# Patient Record
Sex: Female | Born: 1995
Health system: Southern US, Community
[De-identification: ages and names within clinical notes are randomized; demographics above are authoritative.]

## PROBLEM LIST (undated history)

## (undated) ENCOUNTER — Inpatient Hospital Stay (HOSPITAL_COMMUNITY): Payer: Self-pay

## (undated) DIAGNOSIS — R51 Headache: Secondary | ICD-10-CM

## (undated) DIAGNOSIS — IMO0002 Reserved for concepts with insufficient information to code with codable children: Secondary | ICD-10-CM

## (undated) DIAGNOSIS — T7840XA Allergy, unspecified, initial encounter: Secondary | ICD-10-CM

## (undated) DIAGNOSIS — F419 Anxiety disorder, unspecified: Secondary | ICD-10-CM

## (undated) DIAGNOSIS — R519 Headache, unspecified: Secondary | ICD-10-CM

## (undated) DIAGNOSIS — J302 Other seasonal allergic rhinitis: Secondary | ICD-10-CM

## (undated) HISTORY — PX: KNEE SURGERY: SHX244

## (undated) HISTORY — DX: Allergy, unspecified, initial encounter: T78.40XA

---
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
## 2014-02-17 ENCOUNTER — Inpatient Hospital Stay (HOSPITAL_COMMUNITY)
Admission: AD | Admit: 2014-02-17 | Discharge: 2014-02-17 | Disposition: A | Discharge status: Home / Self Care | Payer: BC Managed Care – PPO | Source: Ambulatory Visit | Attending: Obstetrics and Gynecology | Admitting: Obstetrics and Gynecology

## 2014-02-17 ENCOUNTER — Encounter (HOSPITAL_COMMUNITY): Payer: Self-pay

## 2014-02-17 DIAGNOSIS — N909 Noninflammatory disorder of vulva and perineum, unspecified: Secondary | ICD-10-CM | POA: Insufficient documentation

## 2014-02-17 DIAGNOSIS — L293 Anogenital pruritus, unspecified: Secondary | ICD-10-CM | POA: Insufficient documentation

## 2014-02-17 DIAGNOSIS — N9089 Other specified noninflammatory disorders of vulva and perineum: Secondary | ICD-10-CM

## 2014-02-17 DIAGNOSIS — F17200 Nicotine dependence, unspecified, uncomplicated: Secondary | ICD-10-CM | POA: Insufficient documentation

## 2014-02-17 HISTORY — DX: Other seasonal allergic rhinitis: J30.2

## 2014-02-17 LAB — URINALYSIS, ROUTINE W REFLEX MICROSCOPIC
Bilirubin Urine: NEGATIVE
GLUCOSE, UA: NEGATIVE mg/dL
HGB URINE DIPSTICK: NEGATIVE
KETONES UR: NEGATIVE mg/dL
Leukocytes, UA: NEGATIVE
Nitrite: NEGATIVE
PROTEIN: NEGATIVE mg/dL
Specific Gravity, Urine: 1.025 (ref 1.005–1.030)
UROBILINOGEN UA: 0.2 mg/dL (ref 0.0–1.0)
pH: 6 (ref 5.0–8.0)

## 2014-02-17 LAB — WET PREP, GENITAL
Clue Cells Wet Prep HPF POC: NONE SEEN
Trich, Wet Prep: NONE SEEN
Yeast Wet Prep HPF POC: NONE SEEN

## 2014-02-17 LAB — POCT PREGNANCY, URINE: PREG TEST UR: NEGATIVE

## 2014-02-17 NOTE — MAU Provider Note (Signed)
History     CSN: [id number]  Arrival date and [id number]: 02/17/14 [id number]   First Provider Initiated Contact with Patient 02/17/14 [phone]      Chief Complaint  Patient presents with  . Abdominal Pain   HPI Dawn Proctor 18 y.o. Comes to MAU with vulvar irritation which she describes as itching and thick discharge.  Thinks she has a yeast infection and thinks she has been reexposed to chlamydia.  LMP was 02-11-14 and last intercourse was prior to her period.  Does not use condoms and is currently not using any birth control.  States pills cause nausea and she used them for 2 years.  States she used Depo x 6 months and it caused hair loss.  Has an OB/GYN and needs to make an appointment to see them.  OB History   Grav Para Term Preterm Abortions TAB SAB Ect Mult Living   0               Past Medical History  Diagnosis Date  . Allergy   . Anxiety   . Seasonal allergies     History reviewed. No pertinent past surgical history.  Family History  Problem Relation Age of Onset  . Emphysema Paternal Grandmother     History  Substance Use Topics  . Smoking status: Current Every Day Smoker -- 1.00 packs/day    Types: Cigarettes  . Smokeless tobacco: Never Used  . Alcohol Use: No    Allergies: No Known Allergies  Prescriptions prior to admission  Medication Sig Dispense Refill  . acetaminophen (TYLENOL) 325 MG tablet Take 650 mg by mouth every 6 (six) hours as needed for headache.        Review of Systems  Constitutional: Negative for fever.  Gastrointestinal: Negative for nausea, vomiting and abdominal pain.  Genitourinary: Negative for dysuria.       Vulvar itching Vaginal discharge. No vaginal bleeding.   Physical Exam   Blood pressure 108/60, pulse 56, height 5\' 5"  (1.651 m), weight 148 lb 6 oz (67.302 kg).  Physical Exam  Nursing note and vitals reviewed. Constitutional: She is oriented to person, place, and time. She appears well-developed and well-nourished.   HENT:  Head: Normocephalic.  Eyes: EOM are normal.  Neck: Neck supple.  GI: Soft. There is no tenderness.  Genitourinary:  Speculum exam: Vulva  At introitus, tissue is slightly edematous and slightly pearlescent, clitoral piercing noted Vagina - no discharge, no odor, small amount of bleeding noted in cervical mucus. Cervix - No contact bleeding Bimanual exam: Cervix closed Uterus non tender, normal size Adnexa non tender, no masses bilaterally GC/Chlam, wet prep done Chaperone present for exam.  Musculoskeletal: Normal range of motion.  Neurological: She is alert and oriented to person, place, and time.  Skin: Skin is warm and dry.  Psychiatric: She has a normal mood and affect.    MAU Course  Procedures Results for orders placed during the hospital encounter of 02/17/14 (from the past 24 hour(s))  URINALYSIS, ROUTINE W REFLEX MICROSCOPIC     Status: None   Collection Time    02/17/14  9:08 AM      Result Value Ref Range   Color, Urine YELLOW  YELLOW   APPearance CLEAR  CLEAR   Specific Gravity, Urine 1.025  1.005 - 1.030   pH 6.0  5.0 - 8.0   Glucose, UA NEGATIVE  NEGATIVE mg/dL   Hgb urine dipstick NEGATIVE  NEGATIVE   Bilirubin Urine NEGATIVE  NEGATIVE  Ketones, ur NEGATIVE  NEGATIVE mg/dL   Protein, ur NEGATIVE  NEGATIVE mg/dL   Urobilinogen, UA 0.2  0.0 - 1.0 mg/dL   Nitrite NEGATIVE  NEGATIVE   Leukocytes, UA NEGATIVE  NEGATIVE  POCT PREGNANCY, URINE     Status: None   Collection Time    02/17/14  9:19 AM      Result Value Ref Range   Preg Test, Ur NEGATIVE  NEGATIVE  WET PREP, GENITAL     Status: Abnormal   Collection Time    02/17/14 10:04 AM      Result Value Ref Range   Yeast Wet Prep HPF POC NONE SEEN  NONE SEEN   Trich, Wet Prep NONE SEEN  NONE SEEN   Clue Cells Wet Prep HPF POC NONE SEEN  NONE SEEN   WBC, Wet Prep HPF POC FEW (*) NONE SEEN    MDM Records reviewed and client was treated for chlamydia and trich in April 2015.  Discussed  today's results with client - admits she uses vagisil powder daily.  Advised to stop using the powder.  Labs are pending for STD results but no infection found today.  Assessment and Plan  Vulvar irritation likely from Vagisil powder  Plan Make an appointment to be seen in the office for contraception. Use condoms always with intercourse for STD protection. Labs pending. Stop using Vagisil powder.  Do not wear underwear at night.   BURLESON,TERRI 02/17/2014, 10:38 AM

## 2014-02-17 NOTE — MAU Note (Signed)
Awaiting d/c instructions

## 2014-02-17 NOTE — Discharge Instructions (Signed)
Make an appointment in the office to be seen. Always use condoms for intercourse. Do not use Vagisil powder and do not wear underwear at night.  It is fine to wear loose pajama pants or boxer shorts

## 2014-02-19 LAB — GC/CHLAMYDIA PROBE AMP
CT PROBE, AMP APTIMA: NEGATIVE
GC Probe RNA: NEGATIVE

## 2014-03-04 ENCOUNTER — Inpatient Hospital Stay (HOSPITAL_COMMUNITY): Payer: BC Managed Care – PPO

## 2014-03-04 ENCOUNTER — Encounter (HOSPITAL_COMMUNITY): Payer: BC Managed Care – PPO | Admitting: Anesthesiology

## 2014-03-04 ENCOUNTER — Encounter (HOSPITAL_COMMUNITY)
Admission: EM | Disposition: A | Discharge status: Home Health | Payer: Self-pay | Source: Home / Self Care | Attending: Orthopaedic Surgery

## 2014-03-04 ENCOUNTER — Inpatient Hospital Stay (HOSPITAL_COMMUNITY)
Admission: EM | Admit: 2014-03-04 | Discharge: 2014-03-06 | DRG: 908 | Disposition: A | Discharge status: Home Health | Payer: BC Managed Care – PPO | Attending: Orthopaedic Surgery | Admitting: Orthopaedic Surgery

## 2014-03-04 ENCOUNTER — Encounter (HOSPITAL_COMMUNITY): Payer: Self-pay | Admitting: Emergency Medicine

## 2014-03-04 ENCOUNTER — Emergency Department (HOSPITAL_COMMUNITY): Payer: BC Managed Care – PPO

## 2014-03-04 ENCOUNTER — Emergency Department (HOSPITAL_COMMUNITY): Payer: BC Managed Care – PPO | Admitting: Anesthesiology

## 2014-03-04 DIAGNOSIS — S81032A Puncture wound without foreign body, left knee, initial encounter: Secondary | ICD-10-CM

## 2014-03-04 DIAGNOSIS — W3400XA Accidental discharge from unspecified firearms or gun, initial encounter: Secondary | ICD-10-CM

## 2014-03-04 DIAGNOSIS — S91009A Unspecified open wound, unspecified ankle, initial encounter: Principal | ICD-10-CM

## 2014-03-04 DIAGNOSIS — S81009A Unspecified open wound, unspecified knee, initial encounter: Principal | ICD-10-CM | POA: Diagnosis present

## 2014-03-04 DIAGNOSIS — S81809A Unspecified open wound, unspecified lower leg, initial encounter: Principal | ICD-10-CM

## 2014-03-04 DIAGNOSIS — F17200 Nicotine dependence, unspecified, uncomplicated: Secondary | ICD-10-CM | POA: Diagnosis present

## 2014-03-04 DIAGNOSIS — Y9229 Other specified public building as the place of occurrence of the external cause: Secondary | ICD-10-CM

## 2014-03-04 DIAGNOSIS — S82009B Unspecified fracture of unspecified patella, initial encounter for open fracture type I or II: Secondary | ICD-10-CM | POA: Diagnosis present

## 2014-03-04 HISTORY — PX: I & D EXTREMITY: SHX5045

## 2014-03-04 LAB — CBC WITH DIFFERENTIAL/PLATELET
BASOS ABS: 0.1 10*3/uL (ref 0.0–0.1)
BASOS PCT: 1 % (ref 0–1)
EOS PCT: 0 % (ref 0–5)
Eosinophils Absolute: 0 10*3/uL (ref 0.0–0.7)
HEMATOCRIT: 35 % — AB (ref 36.0–46.0)
Hemoglobin: 11 g/dL — ABNORMAL LOW (ref 12.0–15.0)
LYMPHS ABS: 2.8 10*3/uL (ref 0.7–4.0)
Lymphocytes Relative: 47 % — ABNORMAL HIGH (ref 12–46)
MCH: 22.8 pg — AB (ref 26.0–34.0)
MCHC: 31.4 g/dL (ref 30.0–36.0)
MCV: 72.5 fL — AB (ref 78.0–100.0)
MONO ABS: 0.4 10*3/uL (ref 0.1–1.0)
Monocytes Relative: 6 % (ref 3–12)
NEUTROS ABS: 2.7 10*3/uL (ref 1.7–7.7)
Neutrophils Relative %: 46 % (ref 43–77)
PLATELETS: 333 10*3/uL (ref 150–400)
RBC: 4.83 MIL/uL (ref 3.87–5.11)
RDW: 14.3 % (ref 11.5–15.5)
WBC: 6 10*3/uL (ref 4.0–10.5)

## 2014-03-04 LAB — BASIC METABOLIC PANEL
ANION GAP: 21 — AB (ref 5–15)
BUN: 10 mg/dL (ref 6–23)
CO2: 16 mEq/L — ABNORMAL LOW (ref 19–32)
Calcium: 8.9 mg/dL (ref 8.4–10.5)
Chloride: 103 mEq/L (ref 96–112)
Creatinine, Ser: 0.69 mg/dL (ref 0.50–1.10)
GFR calc Af Amer: >90 mL/min (ref >90)
Glucose, Bld: 105 mg/dL — ABNORMAL HIGH (ref 70–99)
POTASSIUM: 3.6 meq/L — AB (ref 3.7–5.3)
SODIUM: 140 meq/L (ref 137–147)

## 2014-03-04 LAB — TYPE AND SCREEN
ABO/RH(D): O POS
ANTIBODY SCREEN: NEGATIVE

## 2014-03-04 LAB — ABO/RH: ABO/RH(D): O POS

## 2014-03-04 SURGERY — IRRIGATION AND DEBRIDEMENT EXTREMITY
Anesthesia: General | Site: Knee | Laterality: Left

## 2014-03-04 MED ORDER — METHOCARBAMOL 1000 MG/10ML IJ SOLN: 500.0000 mg | Freq: Four times a day (QID) | INTRAVENOUS | Status: DC | PRN

## 2014-03-04 MED ORDER — SUCCINYLCHOLINE CHLORIDE 20 MG/ML IJ SOLN
INTRAMUSCULAR | Status: DC | PRN
  Administered 2014-03-04: 120 mg via INTRAVENOUS

## 2014-03-04 MED ORDER — HYDROMORPHONE HCL PF 1 MG/ML IJ SOLN
1.0000 mg | Freq: Once | INTRAMUSCULAR | Status: AC
  Administered 2014-03-04: 1 mg via INTRAVENOUS
  Filled 2014-03-04: qty 1

## 2014-03-04 MED ORDER — PHENYLEPHRINE HCL 10 MG/ML IJ SOLN
INTRAMUSCULAR | Status: DC | PRN
  Administered 2014-03-04: 120 ug via INTRAVENOUS

## 2014-03-04 MED ORDER — TETANUS-DIPHTH-ACELL PERTUSSIS 5-2.5-18.5 LF-MCG/0.5 IM SUSP
0.5000 mL | Freq: Once | INTRAMUSCULAR | Status: AC
  Administered 2014-03-04: 0.5 mL via INTRAMUSCULAR
  Filled 2014-03-04: qty 0.5

## 2014-03-04 MED ORDER — ONDANSETRON HCL 4 MG/2ML IJ SOLN
INTRAMUSCULAR | Status: DC | PRN
  Administered 2014-03-04: 4 mg via INTRAVENOUS

## 2014-03-04 MED ORDER — METOCLOPRAMIDE HCL 10 MG PO TABS: 5.0000 mg | ORAL_TABLET | Freq: Three times a day (TID) | ORAL | Status: DC | PRN

## 2014-03-04 MED ORDER — NEOSTIGMINE METHYLSULFATE 10 MG/10ML IV SOLN
INTRAVENOUS | Status: AC
  Filled 2014-03-04: qty 1

## 2014-03-04 MED ORDER — BUPIVACAINE HCL (PF) 0.25 % IJ SOLN
INTRAMUSCULAR | Status: DC | PRN
  Administered 2014-03-04: 20 mL

## 2014-03-04 MED ORDER — SODIUM CHLORIDE 0.9 % IV SOLN
Freq: Once | INTRAVENOUS | Status: AC
  Administered 2014-03-04: 05:00:00 via INTRAVENOUS

## 2014-03-04 MED ORDER — SODIUM CHLORIDE 0.9 % IR SOLN
Status: DC | PRN
  Administered 2014-03-04: 1000 mL

## 2014-03-04 MED ORDER — MIDAZOLAM HCL 5 MG/5ML IJ SOLN
INTRAMUSCULAR | Status: DC | PRN
Start: 2014-03-04 — End: 2014-03-04
  Administered 2014-03-04: 2 mg via INTRAVENOUS

## 2014-03-04 MED ORDER — DOCUSATE SODIUM 100 MG PO CAPS
100.0000 mg | ORAL_CAPSULE | Freq: Two times a day (BID) | ORAL | Status: DC
  Administered 2014-03-04 – 2014-03-06 (×5): 100 mg via ORAL
  Filled 2014-03-04 (×6): qty 1

## 2014-03-04 MED ORDER — HYDROMORPHONE HCL PF 1 MG/ML IJ SOLN
INTRAMUSCULAR | Status: AC
  Administered 2014-03-04: 0.5 mg via INTRAVENOUS
  Filled 2014-03-04: qty 1

## 2014-03-04 MED ORDER — FENTANYL CITRATE 0.05 MG/ML IJ SOLN
INTRAMUSCULAR | Status: AC
  Filled 2014-03-04: qty 5

## 2014-03-04 MED ORDER — PHENYLEPHRINE 40 MCG/ML (10ML) SYRINGE FOR IV PUSH (FOR BLOOD PRESSURE SUPPORT)
PREFILLED_SYRINGE | INTRAVENOUS | Status: AC
  Filled 2014-03-04: qty 10

## 2014-03-04 MED ORDER — METHOCARBAMOL 500 MG PO TABS
500.0000 mg | ORAL_TABLET | Freq: Four times a day (QID) | ORAL | Status: DC | PRN
  Administered 2014-03-04 – 2014-03-06 (×7): 500 mg via ORAL
  Filled 2014-03-04 (×7): qty 1

## 2014-03-04 MED ORDER — ONDANSETRON HCL 4 MG/2ML IJ SOLN
INTRAMUSCULAR | Status: AC
  Filled 2014-03-04: qty 2

## 2014-03-04 MED ORDER — FENTANYL CITRATE 0.05 MG/ML IJ SOLN
100.0000 ug | Freq: Once | INTRAMUSCULAR | Status: AC
  Administered 2014-03-04: 100 ug via INTRAVENOUS
  Filled 2014-03-04: qty 2

## 2014-03-04 MED ORDER — GLYCOPYRROLATE 0.2 MG/ML IJ SOLN
INTRAMUSCULAR | Status: AC
  Filled 2014-03-04: qty 4

## 2014-03-04 MED ORDER — CEFAZOLIN SODIUM-DEXTROSE 2-3 GM-% IV SOLR
2.0000 g | Freq: Four times a day (QID) | INTRAVENOUS | Status: AC
  Administered 2014-03-04 (×2): 2 g via INTRAVENOUS
  Filled 2014-03-04 (×3): qty 50

## 2014-03-04 MED ORDER — DEXAMETHASONE SODIUM PHOSPHATE 10 MG/ML IJ SOLN
INTRAMUSCULAR | Status: DC | PRN
  Administered 2014-03-04: 8 mg via INTRAVENOUS

## 2014-03-04 MED ORDER — DEXTROSE 5 % IV SOLN
INTRAVENOUS | Status: DC | PRN
  Administered 2014-03-04: 06:00:00 via INTRAVENOUS

## 2014-03-04 MED ORDER — ENOXAPARIN SODIUM 40 MG/0.4ML ~~LOC~~ SOLN
40.0000 mg | SUBCUTANEOUS | Status: DC
  Administered 2014-03-05 – 2014-03-06 (×2): 40 mg via SUBCUTANEOUS
  Filled 2014-03-04 (×3): qty 0.4

## 2014-03-04 MED ORDER — LACTATED RINGERS IV SOLN
INTRAVENOUS | Status: DC | PRN
  Administered 2014-03-04: 06:00:00 via INTRAVENOUS

## 2014-03-04 MED ORDER — FENTANYL CITRATE 0.05 MG/ML IJ SOLN
INTRAMUSCULAR | Status: DC | PRN
  Administered 2014-03-04: 100 ug via INTRAVENOUS
  Administered 2014-03-04 (×2): 50 ug via INTRAVENOUS

## 2014-03-04 MED ORDER — CEFAZOLIN SODIUM 1-5 GM-% IV SOLN
INTRAVENOUS | Status: DC | PRN
  Administered 2014-03-04 (×2): 1 g via INTRAVENOUS

## 2014-03-04 MED ORDER — MIDAZOLAM HCL 2 MG/2ML IJ SOLN
INTRAMUSCULAR | Status: AC
  Filled 2014-03-04: qty 2

## 2014-03-04 MED ORDER — METOCLOPRAMIDE HCL 5 MG/ML IJ SOLN: 5.0000 mg | Freq: Three times a day (TID) | INTRAMUSCULAR | Status: DC | PRN

## 2014-03-04 MED ORDER — LIDOCAINE HCL (CARDIAC) 20 MG/ML IV SOLN
INTRAVENOUS | Status: DC | PRN
  Administered 2014-03-04: 60 mg via INTRAVENOUS

## 2014-03-04 MED ORDER — LIDOCAINE HCL (CARDIAC) 20 MG/ML IV SOLN
INTRAVENOUS | Status: AC
  Filled 2014-03-04: qty 5

## 2014-03-04 MED ORDER — HYDROMORPHONE HCL PF 1 MG/ML IJ SOLN
0.2500 mg | INTRAMUSCULAR | Status: DC | PRN
  Administered 2014-03-04 (×4): 0.5 mg via INTRAVENOUS

## 2014-03-04 MED ORDER — NEOSTIGMINE METHYLSULFATE 10 MG/10ML IV SOLN
INTRAVENOUS | Status: DC | PRN
  Administered 2014-03-04: 4 mg via INTRAVENOUS

## 2014-03-04 MED ORDER — GLYCOPYRROLATE 0.2 MG/ML IJ SOLN
INTRAMUSCULAR | Status: DC | PRN
  Administered 2014-03-04: .8 mg via INTRAVENOUS

## 2014-03-04 MED ORDER — CEFAZOLIN SODIUM 1-5 GM-% IV SOLN
1.0000 g | Freq: Once | INTRAVENOUS | Status: AC
  Administered 2014-03-04: 1 g via INTRAVENOUS
  Filled 2014-03-04: qty 50

## 2014-03-04 MED ORDER — PROPOFOL 10 MG/ML IV BOLUS
INTRAVENOUS | Status: DC | PRN
  Administered 2014-03-04: 200 mg via INTRAVENOUS

## 2014-03-04 MED ORDER — HYDROCODONE-ACETAMINOPHEN 5-325 MG PO TABS
1.0000 | ORAL_TABLET | ORAL | Status: DC | PRN
  Administered 2014-03-04: 2 via ORAL
  Filled 2014-03-04: qty 2

## 2014-03-04 MED ORDER — HYDROMORPHONE HCL PF 1 MG/ML IJ SOLN
0.5000 mg | INTRAMUSCULAR | Status: DC | PRN
  Administered 2014-03-04 (×2): 1 mg via INTRAVENOUS
  Filled 2014-03-04 (×2): qty 1

## 2014-03-04 MED ORDER — POTASSIUM CHLORIDE IN NACL 20-0.9 MEQ/L-% IV SOLN
INTRAVENOUS | Status: DC
  Administered 2014-03-04 – 2014-03-05 (×2): via INTRAVENOUS
  Filled 2014-03-04 (×6): qty 1000

## 2014-03-04 MED ORDER — ACETAMINOPHEN 325 MG PO TABS: 650.0000 mg | ORAL_TABLET | Freq: Four times a day (QID) | ORAL | Status: DC | PRN

## 2014-03-04 MED ORDER — ONDANSETRON HCL 4 MG PO TABS: 4.0000 mg | ORAL_TABLET | Freq: Four times a day (QID) | ORAL | Status: DC | PRN

## 2014-03-04 MED ORDER — CEFAZOLIN SODIUM 1-5 GM-% IV SOLN
INTRAVENOUS | Status: AC
  Filled 2014-03-04: qty 50

## 2014-03-04 MED ORDER — ROCURONIUM BROMIDE 100 MG/10ML IV SOLN
INTRAVENOUS | Status: DC | PRN
  Administered 2014-03-04: 20 mg via INTRAVENOUS

## 2014-03-04 MED ORDER — METHOCARBAMOL 500 MG PO TABS
ORAL_TABLET | ORAL | Status: AC
  Filled 2014-03-04: qty 1

## 2014-03-04 MED ORDER — SODIUM CHLORIDE 0.9 % IV SOLN
INTRAVENOUS | Status: DC | PRN
  Administered 2014-03-04: 06:00:00 via INTRAVENOUS

## 2014-03-04 MED ORDER — OXYCODONE-ACETAMINOPHEN 5-325 MG PO TABS
1.0000 | ORAL_TABLET | ORAL | Status: DC | PRN
  Administered 2014-03-04 – 2014-03-06 (×11): 2 via ORAL
  Filled 2014-03-04 (×11): qty 2

## 2014-03-04 MED ORDER — ARTIFICIAL TEARS OP OINT
TOPICAL_OINTMENT | OPHTHALMIC | Status: AC
  Filled 2014-03-04: qty 3.5

## 2014-03-04 MED ORDER — ONDANSETRON HCL 4 MG/2ML IJ SOLN: 4.0000 mg | Freq: Once | INTRAMUSCULAR | Status: DC | PRN

## 2014-03-04 MED ORDER — ARTIFICIAL TEARS OP OINT
TOPICAL_OINTMENT | OPHTHALMIC | Status: DC | PRN
  Administered 2014-03-04: 1 via OPHTHALMIC

## 2014-03-04 MED ORDER — PROPOFOL 10 MG/ML IV BOLUS
INTRAVENOUS | Status: AC
Start: 2014-03-04 — End: 2014-03-04
  Filled 2014-03-04: qty 20

## 2014-03-04 MED ORDER — CEFAZOLIN SODIUM-DEXTROSE 2-3 GM-% IV SOLR: 2.0000 g | Freq: Once | INTRAVENOUS | Status: DC

## 2014-03-04 MED ORDER — ONDANSETRON HCL 4 MG/2ML IJ SOLN: 4.0000 mg | Freq: Four times a day (QID) | INTRAMUSCULAR | Status: DC | PRN

## 2014-03-04 SURGICAL SUPPLY — 46 items
BANDAGE ELASTIC 6 VELCRO ST LF (GAUZE/BANDAGES/DRESSINGS) ×1 IMPLANT
BANDAGE ESMARK 6X9 LF (GAUZE/BANDAGES/DRESSINGS) IMPLANT
BNDG CMPR 9X6 STRL LF SNTH (GAUZE/BANDAGES/DRESSINGS) ×1
BNDG ESMARK 6X9 LF (GAUZE/BANDAGES/DRESSINGS) ×2
CLEANER TIP ELECTROSURG 2X2 (MISCELLANEOUS) ×1 IMPLANT
CONT SPEC 4OZ CLIKSEAL STRL BL (MISCELLANEOUS) ×1 IMPLANT
COVER SURGICAL LIGHT HANDLE (MISCELLANEOUS) ×2 IMPLANT
CUFF TOURNIQUET SINGLE 18IN (TOURNIQUET CUFF) ×2 IMPLANT
CUFF TOURNIQUET SINGLE 24IN (TOURNIQUET CUFF) ×1 IMPLANT
ELECT REM PT RETURN 9FT ADLT (ELECTROSURGICAL)
ELECTRODE REM PT RTRN 9FT ADLT (ELECTROSURGICAL) IMPLANT
GAUZE XEROFORM 1X8 LF (GAUZE/BANDAGES/DRESSINGS) ×1 IMPLANT
GLOVE BIO SURGEON STRL SZ8 (GLOVE) ×1 IMPLANT
GLOVE BIOGEL PI IND STRL 8 (GLOVE) ×1 IMPLANT
GLOVE BIOGEL PI INDICATOR 8 (GLOVE) ×1
GLOVE ORTHO TXT STRL SZ7.5 (GLOVE) ×2 IMPLANT
GOWN STRL REUS W/ TWL LRG LVL3 (GOWN DISPOSABLE) ×2 IMPLANT
GOWN STRL REUS W/ TWL XL LVL3 (GOWN DISPOSABLE) ×1 IMPLANT
GOWN STRL REUS W/TWL LRG LVL3 (GOWN DISPOSABLE) ×4
GOWN STRL REUS W/TWL XL LVL3 (GOWN DISPOSABLE) ×2
IMMOBILIZER KNEE 22  40 CIR (ORTHOPEDIC SUPPLIES) ×1
IMMOBILIZER KNEE 22 40 CIR (ORTHOPEDIC SUPPLIES) IMPLANT
IMMOBILIZER KNEE 22 UNIV (SOFTGOODS) ×1 IMPLANT
KIT BASIN OR (CUSTOM PROCEDURE TRAY) ×2 IMPLANT
KIT ROOM TURNOVER OR (KITS) ×2 IMPLANT
MANIFOLD NEPTUNE II (INSTRUMENTS) ×2 IMPLANT
NEEDLE 22X1 1/2 (OR ONLY) (NEEDLE) ×1 IMPLANT
NS IRRIG 1000ML POUR BTL (IV SOLUTION) ×2 IMPLANT
PACK ORTHO EXTREMITY (CUSTOM PROCEDURE TRAY) ×2 IMPLANT
PAD ABD 8X10 STRL (GAUZE/BANDAGES/DRESSINGS) ×1 IMPLANT
PAD ARMBOARD 7.5X6 YLW CONV (MISCELLANEOUS) ×4 IMPLANT
PADDING CAST COTTON 6X4 STRL (CAST SUPPLIES) ×1 IMPLANT
SPONGE GAUZE 4X4 12PLY STER LF (GAUZE/BANDAGES/DRESSINGS) ×1 IMPLANT
SPONGE LAP 18X18 X RAY DECT (DISPOSABLE) ×2 IMPLANT
STAPLER VISISTAT 35W (STAPLE) ×1 IMPLANT
STOCKINETTE IMPERVIOUS 9X36 MD (GAUZE/BANDAGES/DRESSINGS) ×2 IMPLANT
SUT VIC AB 0 CT1 27 (SUTURE) ×2
SUT VIC AB 0 CT1 27XBRD ANBCTR (SUTURE) IMPLANT
SUT VIC AB 2-0 CT1 27 (SUTURE) ×2
SUT VIC AB 2-0 CT1 TAPERPNT 27 (SUTURE) IMPLANT
SYR BULB IRRIGATION 50ML (SYRINGE) ×1 IMPLANT
SYR CONTROL 10ML LL (SYRINGE) ×1 IMPLANT
TOWEL OR 17X26 10 PK STRL BLUE (TOWEL DISPOSABLE) ×2 IMPLANT
TUBE CONNECTING 12X1/4 (SUCTIONS) ×2 IMPLANT
UNDERPAD 30X30 INCONTINENT (UNDERPADS AND DIAPERS) ×2 IMPLANT
YANKAUER SUCT BULB TIP NO VENT (SUCTIONS) ×2 IMPLANT

## 2014-03-04 NOTE — Progress Notes (Signed)
Patient requests " that D stuff in the IV". Patients mother states no, tells patient she is medicated with pills.
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
## 2014-03-04 NOTE — Anesthesia Procedure Notes (Signed)
Procedure Name: Intubation Date/Time: 03/04/2014 5:59 AM Performed by: Wray KearnsFOLEY, Olanda Downie A Pre-anesthesia Checklist: Patient identified, Timeout performed, Emergency Drugs available, Suction available and Patient being monitored Patient Re-evaluated:Patient Re-evaluated prior to inductionOxygen Delivery Method: Circle system utilized Preoxygenation: Pre-oxygenation with 100% oxygen Intubation Type: IV induction, Rapid sequence and Cricoid Pressure applied Laryngoscope Size: Mac and 4 Grade View: Grade I Tube type: Oral Tube size: 7.0 mm Number of attempts: 1 Airway Equipment and Method: Stylet Placement Confirmation: ETT inserted through vocal cords under direct vision,  breath sounds checked- equal and bilateral and positive ETCO2 Secured at: 22 cm Tube secured with: Tape Dental Injury: Teeth and Oropharynx as per pre-operative assessment
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
## 2014-03-04 NOTE — Progress Notes (Signed)
Utilization review completed.  
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
## 2014-03-05 LAB — BASIC METABOLIC PANEL
Anion gap: 17 — ABNORMAL HIGH (ref 5–15)
BUN: 8 mg/dL (ref 6–23)
CO2: 17 mEq/L — ABNORMAL LOW (ref 19–32)
CREATININE: 0.69 mg/dL (ref 0.50–1.10)
Calcium: 8.7 mg/dL (ref 8.4–10.5)
Chloride: 102 mEq/L (ref 96–112)
GFR calc Af Amer: >90 mL/min (ref >90)
Glucose, Bld: 79 mg/dL (ref 70–99)
Potassium: 4.8 mEq/L (ref 3.7–5.3)
Sodium: 136 mEq/L — ABNORMAL LOW (ref 137–147)

## 2014-03-05 LAB — MRSA PCR SCREENING: MRSA by PCR: NEGATIVE

## 2014-03-05 LAB — CBC
HCT: 31.2 % — ABNORMAL LOW (ref 36.0–46.0)
Hemoglobin: 9.7 g/dL — ABNORMAL LOW (ref 12.0–15.0)
MCH: 22.1 pg — AB (ref 26.0–34.0)
MCHC: 31.1 g/dL (ref 30.0–36.0)
MCV: 71.1 fL — AB (ref 78.0–100.0)
Platelets: 338 10*3/uL (ref 150–400)
RBC: 4.39 MIL/uL (ref 3.87–5.11)
RDW: 14 % (ref 11.5–15.5)
WBC: 10.5 10*3/uL (ref 4.0–10.5)
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
## 2014-03-05 NOTE — Evaluation (Signed)
I have read and agree with this note.   Time in/out:1311-1337  Total time: 26 minutes (Ev, 1SC)  Ignacia Palmaathy Eustace Hur, OTR/L [phone]
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
## 2014-03-06 ENCOUNTER — Encounter (HOSPITAL_COMMUNITY): Payer: Self-pay | Admitting: Orthopaedic Surgery

## 2014-03-06 MED ORDER — OXYCODONE-ACETAMINOPHEN 5-325 MG PO TABS: 1.0000 | ORAL_TABLET | ORAL | Status: DC | PRN

## 2014-03-06 MED ORDER — ASPIRIN 325 MG PO TABS: 325.0000 mg | ORAL_TABLET | Freq: Every day | ORAL | Status: DC

## 2014-03-06 NOTE — Progress Notes (Signed)
Orthopedic Tech Progress Note Patient Details:  Dawn BridgemanBrianna M Proctor [birth year] [id number]  Ortho Devices Type of Ortho Device: Crutches Ortho Device/Splint Interventions: Application   Shawnie PonsCammer, Tyese Finken Carol 03/06/2014, 2:53 PM

## 2014-03-06 NOTE — Progress Notes (Signed)
I have read and agree with this note.   Time in/out: 812-852 Total time: 40 minutes (3SC)  Ignacia Palmaathy Etheleen Valtierra, OTR/L [phone]
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
## 2014-03-06 NOTE — Progress Notes (Signed)
Patient discharged to home accompanied by family. Discharge instructions and rx given and explained and patient stated understanding. IV was removed and patient left unit in a stable condition with all personal belongings via wheelchair.  
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
## 2014-05-14 ENCOUNTER — Inpatient Hospital Stay (HOSPITAL_COMMUNITY)
Admission: AD | Admit: 2014-05-14 | Discharge: 2014-05-14 | Disposition: A | Discharge status: Home / Self Care | Payer: BC Managed Care – PPO | Source: Ambulatory Visit | Attending: Family Medicine | Admitting: Family Medicine

## 2014-05-14 ENCOUNTER — Encounter (HOSPITAL_COMMUNITY): Payer: Self-pay | Admitting: *Deleted

## 2014-05-14 DIAGNOSIS — A499 Bacterial infection, unspecified: Secondary | ICD-10-CM | POA: Insufficient documentation

## 2014-05-14 DIAGNOSIS — B9689 Other specified bacterial agents as the cause of diseases classified elsewhere: Secondary | ICD-10-CM | POA: Insufficient documentation

## 2014-05-14 DIAGNOSIS — N390 Urinary tract infection, site not specified: Secondary | ICD-10-CM | POA: Diagnosis not present

## 2014-05-14 DIAGNOSIS — R300 Dysuria: Secondary | ICD-10-CM | POA: Diagnosis present

## 2014-05-14 DIAGNOSIS — N760 Acute vaginitis: Secondary | ICD-10-CM | POA: Diagnosis not present

## 2014-05-14 DIAGNOSIS — F17200 Nicotine dependence, unspecified, uncomplicated: Secondary | ICD-10-CM | POA: Diagnosis not present

## 2014-05-14 LAB — WET PREP, GENITAL
Trich, Wet Prep: NONE SEEN
Yeast Wet Prep HPF POC: NONE SEEN

## 2014-05-14 LAB — POCT PREGNANCY, URINE: Preg Test, Ur: NEGATIVE

## 2014-05-14 LAB — URINALYSIS, ROUTINE W REFLEX MICROSCOPIC
Bilirubin Urine: NEGATIVE
Glucose, UA: NEGATIVE mg/dL
Ketones, ur: NEGATIVE mg/dL
Nitrite: NEGATIVE
Protein, ur: NEGATIVE mg/dL
Specific Gravity, Urine: 1.015 (ref 1.005–1.030)
Urobilinogen, UA: 0.2 mg/dL (ref 0.0–1.0)
pH: 6 (ref 5.0–8.0)

## 2014-05-14 LAB — URINE MICROSCOPIC-ADD ON

## 2014-05-14 LAB — RPR

## 2014-05-14 LAB — HIV ANTIBODY (ROUTINE TESTING W REFLEX): HIV: NONREACTIVE

## 2014-05-14 MED ORDER — METRONIDAZOLE 0.75 % VA GEL: 1.0000 | Freq: Every day | VAGINAL | Status: DC

## 2014-05-14 MED ORDER — SULFAMETHOXAZOLE-TMP DS 800-160 MG PO TABS: 1.0000 | ORAL_TABLET | Freq: Two times a day (BID) | ORAL | Status: DC

## 2014-05-14 NOTE — MAU Note (Signed)
Frequent urination,pain when finished.  White d/c, no irritation.  No pain during sex, but it hurts after when she uses the restroom

## 2014-05-14 NOTE — MAU Provider Note (Signed)
[facility] Visit  Patient name: Dawn Proctor MRN [id number]  Date of birth: [birth year]  CC & HPI:  Dawn Proctor is a 18 y.o. African American female presenting today for c/o dysuria.  She also c/o thicker white vaginal d/c.  She requests STD screening.    Pertinent History Reviewed:  Medical & Surgical Hx:   Past Medical History  Diagnosis Date  . Allergy   . Anxiety   . Seasonal allergies    Past Surgical History  Procedure Laterality Date  . I&d extremity Left 03/04/2014    Procedure: IRRIGATION AND DEBRIDEMENT KNEE ARTHROTOMY WITH REMOVAL OF BULLET FRAGMENTS;  Surgeon: Eldred Manges, MD;  Location: [facility] OR;  Service: Orthopedics;  Laterality: Left;   Medications: Reviewed & Updated - see associated section Social History: Reviewed -  reports that she has been smoking Cigarettes.  She has been smoking about 1.00 pack per day. She has never used smokeless tobacco.  Objective Findings:  Vitals: BP 112/58  Pulse 64  Temp(Src) 98.9 F (37.2 C) (Oral)  Resp 16  Ht  (1.651 m)  Wt 65.772 kg (145 lb)  BMI 24.13 kg/m2  LMP 05/01/2014  Physical Examination: General appearance - alert, well appearing, and in no distress Mental status - alert, oriented to person, place, and time Chest - clear to auscultation, no wheezes, rales or rhonchi, symmetric air entry Heart - normal rate and regular rhythm Abdomen - soft, nontender, nondistended, no masses or organomegaly Pelvic - SSE:  Thin white discharge with amine odor. Extremities - no pedal edema noted  Results for orders placed during the hospital encounter of 05/14/14 (from the past 24 hour(s))  URINALYSIS, ROUTINE W REFLEX MICROSCOPIC   Collection Time    05/14/14  4:07 PM      Result Value Ref Range   Color, Urine YELLOW  YELLOW   APPearance HAZY (*) CLEAR   Specific Gravity, Urine 1.015  1.005 - 1.030   pH 6.0  5.0 - 8.0   Glucose, UA NEGATIVE  NEGATIVE mg/dL   Hgb urine dipstick LARGE (*) NEGATIVE   Bilirubin Urine NEGATIVE  NEGATIVE   Ketones, ur NEGATIVE  NEGATIVE mg/dL   Protein, ur NEGATIVE  NEGATIVE mg/dL   Urobilinogen, UA 0.2  0.0 - 1.0 mg/dL   Nitrite NEGATIVE  NEGATIVE   Leukocytes, UA MODERATE (*) NEGATIVE  URINE MICROSCOPIC-ADD ON   Collection Time    05/14/14  4:07 PM      Result Value Ref Range   Squamous Epithelial / LPF FEW (*) RARE   WBC, UA TOO NUMEROUS TO COUNT  <3 WBC/hpf   RBC / HPF 11-20  <3 RBC/hpf   Bacteria, UA FEW (*) RARE   Urine-Other MUCOUS PRESENT    POCT PREGNANCY, URINE   Collection Time    05/14/14  4:10 PM      Result Value Ref Range   Preg Test, Ur NEGATIVE  NEGATIVE  WET PREP, GENITAL   Collection Time    05/14/14  5:30 PM      Result Value Ref Range   Yeast Wet Prep HPF POC NONE SEEN  NONE SEEN   Trich, Wet Prep NONE SEEN  NONE SEEN   Clue Cells Wet Prep HPF POC MODERATE (*) NONE SEEN   WBC, Wet Prep HPF POC MANY (*) NONE SEEN     Assessment & Plan:  A:   UTI  Bacterial vaginosis P:  Septra DS PO BID X 5  Metrogel qhs X5   CRESENZO-DISHMAN,Jamey Demchak CNM 05/14/2014 5:56 PM

## 2014-05-14 NOTE — Discharge Instructions (Signed)
Urinary Tract Infection Urinary tract infections (UTIs) can develop anywhere along your urinary tract. Your urinary tract is your body's drainage system for removing wastes and extra water. Your urinary tract includes two kidneys, two ureters, a bladder, and a urethra. Your kidneys are a pair of bean-shaped organs. Each kidney is about the size of your fist. They are located below your ribs, one on each side of your spine. CAUSES Infections are caused by microbes, which are microscopic organisms, including fungi, viruses, and bacteria. These organisms are so small that they can only be seen through a microscope. Bacteria are the microbes that most commonly cause UTIs. SYMPTOMS  Symptoms of UTIs may vary by age and gender of the patient and by the location of the infection. Symptoms in young women typically include a frequent and intense urge to urinate and a painful, burning feeling in the bladder or urethra during urination. Older women and men are more likely to be tired, shaky, and weak and have muscle aches and abdominal pain. A fever may mean the infection is in your kidneys. Other symptoms of a kidney infection include pain in your back or sides below the ribs, nausea, and vomiting. DIAGNOSIS To diagnose a UTI, your caregiver will ask you about your symptoms. Your caregiver also will ask to provide a urine sample. The urine sample will be tested for bacteria and white blood cells. White blood cells are made by your body to help fight infection. TREATMENT  Typically, UTIs can be treated with medication. Because most UTIs are caused by a bacterial infection, they usually can be treated with the use of antibiotics. The choice of antibiotic and length of treatment depend on your symptoms and the type of bacteria causing your infection. HOME CARE INSTRUCTIONS  If you were prescribed antibiotics, take them exactly as your caregiver instructs you. Finish the medication even if you feel better after you  have only taken some of the medication.  Drink enough water and fluids to keep your urine clear or pale yellow.  Avoid caffeine, tea, and carbonated beverages. They tend to irritate your bladder.  Empty your bladder often. Avoid holding urine for long periods of time.  Empty your bladder before and after sexual intercourse.  After a bowel movement, women should cleanse from front to back. Use each tissue only once. SEEK MEDICAL CARE IF:   You have back pain.  You develop a fever.  Your symptoms do not begin to resolve within 3 days. SEEK IMMEDIATE MEDICAL CARE IF:   You have severe back pain or lower abdominal pain.  You develop chills.  You have nausea or vomiting.  You have continued burning or discomfort with urination. MAKE SURE YOU:   Understand these instructions.  Will watch your condition.  Will get help right away if you are not doing well or get worse. Document Released: 05/13/2005 Document Revised: 02/02/2012 Document Reviewed: 09/11/2011 ExitCare Patient Information 2015 [employer]. This information is not intended to replace advice given to you by your health care provider. Make sure you discuss any questions you have with your health care provider.  Bacterial Vaginosis Bacterial vaginosis is a vaginal infection that occurs when the normal balance of bacteria in the vagina is disrupted. It results from an overgrowth of certain bacteria. This is the most common vaginal infection in women of childbearing age. Treatment is important to prevent complications, especially in pregnant women, as it can cause a premature delivery. CAUSES  Bacterial vaginosis is caused by an   increase in harmful bacteria that are normally present in smaller amounts in the vagina. Several different kinds of bacteria can cause bacterial vaginosis. However, the reason that the condition develops is not fully understood. RISK FACTORS Certain activities or behaviors can put you at an  increased risk of developing bacterial vaginosis, including:  Having a new sex partner or multiple sex partners.  Douching.  Using an intrauterine device (IUD) for contraception. Women do not get bacterial vaginosis from toilet seats, bedding, swimming pools, or contact with objects around them. SIGNS AND SYMPTOMS  Some women with bacterial vaginosis have no signs or symptoms. Common symptoms include:  Grey vaginal discharge.  A fishlike odor with discharge, especially after sexual intercourse.  Itching or burning of the vagina and vulva.  Burning or pain with urination. DIAGNOSIS  Your health care provider will take a medical history and examine the vagina for signs of bacterial vaginosis. A sample of vaginal fluid may be taken. Your health care provider will look at this sample under a microscope to check for bacteria and abnormal cells. A vaginal pH test may also be done.  TREATMENT  Bacterial vaginosis may be treated with antibiotic medicines. These may be given in the form of a pill or a vaginal cream. A second round of antibiotics may be prescribed if the condition comes back after treatment.  HOME CARE INSTRUCTIONS   Only take over-the-counter or prescription medicines as directed by your health care provider.  If antibiotic medicine was prescribed, take it as directed. Make sure you finish it even if you start to feel better.  Do not have sex until treatment is completed.  Tell all sexual partners that you have a vaginal infection. They should see their health care provider and be treated if they have problems, such as a mild rash or itching.  Practice safe sex by using condoms and only having one sex partner. SEEK MEDICAL CARE IF:   Your symptoms are not improving after 3 days of treatment.  You have increased discharge or pain.  You have a fever. MAKE SURE YOU:   Understand these instructions.  Will watch your condition.  Will get help right away if you are not  doing well or get worse. FOR MORE INFORMATION  Centers for Disease Control and Prevention, Division of STD Prevention: www.cdc.gov/std American Sexual Health Association (ASHA): www.ashastd.org  Document Released: 08/03/2005 Document Revised: 05/24/2013 Document Reviewed: 03/15/2013 ExitCare Patient Information 2015 [employer]. This information is not intended to replace advice given to you by your health care provider. Make sure you discuss any questions you have with your health care provider.  

## 2014-05-15 LAB — HSV 2 ANTIBODY, IGG: HSV 2 Glycoprotein G Ab, IgG: 0.19 IV

## 2014-05-15 LAB — GC/CHLAMYDIA PROBE AMP
CT PROBE, AMP APTIMA: NEGATIVE
GC Probe RNA: NEGATIVE

## 2014-05-15 NOTE — MAU Provider Note (Signed)
Attestation of Attending Supervision of Advanced Practitioner (PA/CNM/NP): Evaluation and management procedures were performed by the Advanced Practitioner under my supervision and collaboration.  I have reviewed the Advanced Practitioner's note and chart, and I agree with the management and plan.  Jacob Stinson, DO Attending Physician Faculty Practice, [facility]  

## 2014-05-17 LAB — URINE CULTURE: Colony Count: >=100000

## 2014-06-01 ENCOUNTER — Other Ambulatory Visit: Payer: Self-pay

## 2014-06-18 ENCOUNTER — Encounter (HOSPITAL_COMMUNITY): Payer: Self-pay | Admitting: Emergency Medicine

## 2014-06-18 ENCOUNTER — Ambulatory Visit (HOSPITAL_COMMUNITY): Payer: BC Managed Care – PPO | Attending: Emergency Medicine

## 2014-06-18 ENCOUNTER — Emergency Department (HOSPITAL_COMMUNITY)
Admission: EM | Admit: 2014-06-18 | Discharge: 2014-06-18 | Disposition: A | Discharge status: Home / Self Care | Payer: Worker's Compensation | Attending: Emergency Medicine | Admitting: Emergency Medicine

## 2014-06-18 DIAGNOSIS — Z792 Long term (current) use of antibiotics: Secondary | ICD-10-CM | POA: Insufficient documentation

## 2014-06-18 DIAGNOSIS — Z7982 Long term (current) use of aspirin: Secondary | ICD-10-CM | POA: Insufficient documentation

## 2014-06-18 DIAGNOSIS — S79912A Unspecified injury of left hip, initial encounter: Secondary | ICD-10-CM | POA: Insufficient documentation

## 2014-06-18 DIAGNOSIS — W19XXXA Unspecified fall, initial encounter: Secondary | ICD-10-CM

## 2014-06-18 DIAGNOSIS — S8002XA Contusion of left knee, initial encounter: Secondary | ICD-10-CM | POA: Diagnosis not present

## 2014-06-18 DIAGNOSIS — Z720 Tobacco use: Secondary | ICD-10-CM | POA: Diagnosis not present

## 2014-06-18 DIAGNOSIS — S8992XA Unspecified injury of left lower leg, initial encounter: Secondary | ICD-10-CM | POA: Diagnosis present

## 2014-06-18 DIAGNOSIS — M25552 Pain in left hip: Secondary | ICD-10-CM

## 2014-06-18 DIAGNOSIS — F419 Anxiety disorder, unspecified: Secondary | ICD-10-CM | POA: Diagnosis not present

## 2014-06-18 MED ORDER — NAPROXEN 500 MG PO TABS: 500.0000 mg | ORAL_TABLET | Freq: Two times a day (BID) | ORAL | Status: DC

## 2014-06-18 MED ORDER — TRAMADOL HCL 50 MG PO TABS: 50.0000 mg | ORAL_TABLET | Freq: Four times a day (QID) | ORAL | Status: DC | PRN

## 2014-06-18 MED ORDER — TRAMADOL HCL 50 MG PO TABS
50.0000 mg | ORAL_TABLET | Freq: Once | ORAL | Status: AC
  Administered 2014-06-18: 50 mg via ORAL
  Filled 2014-06-18: qty 1

## 2014-06-18 NOTE — Discharge Instructions (Signed)
Use crutches for stability and bear weight as tolerated on your left leg. Follow up with your orthopedist. Take Naproxen as prescribed. You may take Tramadol as needed for additional pain control. Ice your knee 3-4 times per day.  Knee Sprain A knee sprain is a tear in one of the strong, fibrous tissues that connect the bones (ligaments) in your knee. The severity of the sprain depends on how much of the ligament is torn. The tear can be either partial or complete. CAUSES  Often, sprains are a result of a fall or injury. The force of the impact causes the fibers of your ligament to stretch too much. This excess tension causes the fibers of your ligament to tear. SIGNS AND SYMPTOMS  You may have some loss of motion in your knee. Other symptoms include:  Bruising.  Pain in the knee area.  Tenderness of the knee to the touch.  Swelling. DIAGNOSIS  To diagnose a knee sprain, your health care provider will physically examine your knee. Your health care provider may also suggest an X-ray exam of your knee to make sure no bones are broken. TREATMENT  If your ligament is only partially torn, treatment usually involves keeping the knee in a fixed position (immobilization) or bracing your knee for activities that require movement for several weeks. To do this, your health care provider will apply a bandage, cast, or splint to keep your knee from moving and to support your knee during movement until it heals. For a partially torn ligament, the healing process usually takes 4-6 weeks. If your ligament is completely torn, depending on which ligament it is, you may need surgery to reconnect the ligament to the bone or reconstruct it. After surgery, a cast or splint may be applied and will need to stay on your knee for 4-6 weeks while your ligament heals. HOME CARE INSTRUCTIONS  Keep your injured knee elevated to decrease swelling.  To ease pain and swelling, apply ice to the injured area:  Put ice in a  plastic bag.  Place a towel between your skin and the bag.  Leave the ice on for 20 minutes, 2-3 times a day.  Only take medicine for pain as directed by your health care provider. Contusion A contusion is a deep bruise. Contusions are the result of an injury that caused bleeding under the skin. The contusion may turn blue, purple, or yellow. Minor injuries will give you a painless contusion, but more severe contusions may stay painful and swollen for a few weeks.  CAUSES  A contusion is usually caused by a blow, trauma, or direct force to an area of the body. SYMPTOMS  Swelling and redness of the injured area. Bruising of the injured area. Tenderness and soreness of the injured area. Pain. DIAGNOSIS  The diagnosis can be made by taking a history and physical exam. An X-ray, CT scan, or MRI may be needed to determine if there were any associated injuries, such as fractures. TREATMENT  Specific treatment will depend on what area of the body was injured. In general, the best treatment for a contusion is resting, icing, elevating, and applying cold compresses to the injured area. Over-the-counter medicines may also be recommended for pain control. Ask your caregiver what the best treatment is for your contusion. HOME CARE INSTRUCTIONS  Put ice on the injured area. Put ice in a plastic bag. Place a towel between your skin and the bag. Leave the ice on for 15-20 minutes, 3-4 times a  day, or as directed by your health care provider. Only take over-the-counter or prescription medicines for pain, discomfort, or fever as directed by your caregiver. Your caregiver may recommend avoiding anti-inflammatory medicines (aspirin, ibuprofen, and naproxen) for 48 hours because these medicines may increase bruising. Rest the injured area. If possible, elevate the injured area to reduce swelling. SEEK IMMEDIATE MEDICAL CARE IF:  You have increased bruising or swelling. You have pain that is getting  worse. Your swelling or pain is not relieved with medicines. MAKE SURE YOU:  Understand these instructions. Will watch your condition. Will get help right away if you are not doing well or get worse. Document Released: 05/13/2005 Document Revised: 08/08/2013 Document Reviewed: 06/08/2011 [facility] ClinicsExitCare Patient Information 2015 [place]ExitCare, [place]LL[place]. This information is not intended to replace advice given to you by your health care provider. Make sure you discuss any questions you have with your health care provider.  o not leave your knee unprotected until pain and stiffness go away (usually 4-6 weeks).  If you have a cast or splint, do not allow it to get wet. If you have been instructed not to remove it, cover it with a plastic bag when you shower or bathe. Do not swim.  Your health care provider may suggest exercises for you to do during your recovery to prevent or limit permanent weakness and stiffness. SEEK IMMEDIATE MEDICAL CARE IF:  Your cast or splint becomes damaged.  Your pain becomes worse.  You have significant pain, swelling, or numbness below the cast or splint. MAKE SURE YOU:  Understand these instructions.  Will watch your condition.  Will get help right away if you are not doing well or get worse. Document Released: 08/03/2005 Document Revised: 05/24/2013 Document Reviewed: 03/15/2013 [facility]Exit[facility] Patient Information 2015 [place]ExitCare, [place]LLC. This information is not intended to replace advice given to you by your health care provider. Make sure you discuss any questions you have with your health care provider. RICE: Routine Care for Injuries The routine care of many injuries includes Rest, Ice, Compression, and Elevation (RICE). HOME CARE INSTRUCTIONS  Rest is needed to allow your body to heal. Routine activities can usually be resumed when comfortable. Injured tendons and bones can take up to 6 weeks to heal. Tendons are the cord-like structures that attach muscle to bone.  Ice  following an injury helps keep the swelling down and reduces pain.  Put ice in a plastic bag.  Place a towel between your skin and the bag.  Leave the ice on for 15-20 minutes, 3-4 times a day, or as directed by your health care provider. Do this while awake, for the first 24 to 48 hours. After that, continue as directed by your caregiver.  Compression helps keep swelling down. It also gives support and helps with discomfort. If an elastic bandage has been applied, it should be removed and reapplied every 3 to 4 hours. It should not be applied tightly, but firmly enough to keep swelling down. Watch fingers or toes for swelling, bluish discoloration, coldness, numbness, or excessive pain. If any of these problems occur, remove the bandage and reapply loosely. Contact your caregiver if these problems continue.  Elevation helps reduce swelling and decreases pain. With extremities, such as the arms, hands, legs, and feet, the injured area should be placed near or above the level of the heart, if possible. SEEK IMMEDIATE MEDICAL CARE IF:  You have persistent pain and swelling.  You develop redness, numbness, or unexpected weakness.  Your symptoms are  getting worse rather than improving after several days. These symptoms may indicate that further evaluation or further X-rays are needed. Sometimes, X-rays may not show a small broken bone (fracture) until 1 week or 10 days later. Make a follow-up appointment with your caregiver. Ask when your X-ray results will be ready. Make sure you get your X-ray results. Document Released: 11/15/2000 Document Revised: 08/08/2013 Document Reviewed: 01/02/2011 [facility] CenterExitCare Patient Information 2015 [place]ExitCare, MarylandLLC. This information is not intended to replace advice given to you by your health care provider. Make sure you discuss any questions you have with your health care provider.

## 2014-06-18 NOTE — ED Notes (Signed)
Pt. slipped and fell at work this evening , no LOC , reports pain at left knee and left hip .
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
## 2014-08-20 ENCOUNTER — Inpatient Hospital Stay (HOSPITAL_COMMUNITY)
Admission: AD | Admit: 2014-08-20 | Discharge: 2014-08-21 | Disposition: A | Discharge status: Home / Self Care | Payer: Self-pay | Source: Ambulatory Visit | Attending: Family Medicine | Admitting: Family Medicine

## 2014-08-20 DIAGNOSIS — F17210 Nicotine dependence, cigarettes, uncomplicated: Secondary | ICD-10-CM | POA: Insufficient documentation

## 2014-08-20 DIAGNOSIS — N390 Urinary tract infection, site not specified: Secondary | ICD-10-CM | POA: Insufficient documentation

## 2014-08-20 NOTE — MAU Note (Signed)
Pt reports she left lower abd pain, left lower back pain, dysuria. Denies fever.

## 2014-08-21 ENCOUNTER — Encounter (HOSPITAL_COMMUNITY): Payer: Self-pay | Admitting: *Deleted

## 2014-08-21 DIAGNOSIS — N390 Urinary tract infection, site not specified: Secondary | ICD-10-CM

## 2014-08-21 LAB — POCT PREGNANCY, URINE: PREG TEST UR: NEGATIVE

## 2014-08-21 LAB — URINALYSIS, ROUTINE W REFLEX MICROSCOPIC
Bilirubin Urine: NEGATIVE
Glucose, UA: NEGATIVE mg/dL
Ketones, ur: NEGATIVE mg/dL
Nitrite: NEGATIVE
PH: 8 (ref 5.0–8.0)
Protein, ur: 30 mg/dL — AB
SPECIFIC GRAVITY, URINE: 1.02 (ref 1.005–1.030)
UROBILINOGEN UA: 1 mg/dL (ref 0.0–1.0)

## 2014-08-21 LAB — URINE MICROSCOPIC-ADD ON

## 2014-08-21 NOTE — Discharge Instructions (Signed)

## 2014-08-21 NOTE — MAU Provider Note (Signed)
History     CSN: [id number]  Arrival date and time: 08/20/14 2311   First Provider Initiated Contact with Patient 08/21/14 0043      Chief Complaint  Patient presents with  . Dysuria   HPI  Dawn Proctor is a 19 y.o. G0P0 who presents today with pain with urination x 1 week, and LLQ and back pain for about 3 days. She states that she has had three UTIs in the last year, and that she usually goes to [facility]. She does not have a PCP. She has taken Macrobid for the UTIs in the past, and does not think that any cultures have been done. She had a UIT 11/20, End of September and in July.   Past Medical History  Diagnosis Date  . Allergy   . Anxiety   . Seasonal allergies     Past Surgical History  Procedure Laterality Date  . I&d extremity Left 03/04/2014    Procedure: IRRIGATION AND DEBRIDEMENT KNEE ARTHROTOMY WITH REMOVAL OF BULLET FRAGMENTS;  Surgeon: Eldred MangesMark C Yates, MD;  Location: [facility] OR;  Service: Orthopedics;  Laterality: Left;    Family History  Problem Relation Age of Onset  . Emphysema Paternal Grandmother     History  Substance Use Topics  . Smoking status: Current Every Day Smoker -- 1.00 packs/day    Types: Cigarettes  . Smokeless tobacco: Never Used  . Alcohol Use: No    Allergies:  Allergies  Allergen Reactions  . Orange Fruit [Citrus] Swelling    Swelling around the lips  . Tomato Swelling    Swelling around the lips    Prescriptions prior to admission  Medication Sig Dispense Refill Last Dose  . aspirin (BAYER ASPIRIN) 325 MG tablet Take 1 tablet (325 mg total) by mouth daily. 30 tablet 0 Past Month at Unknown time  . ibuprofen (ADVIL,MOTRIN) 200 MG tablet Take 400 mg by mouth every 6 (six) hours as needed for mild pain.    Past Week at Unknown time  . metroNIDAZOLE (METROGEL) 0.75 % vaginal gel Place 1 Applicatorful vaginally at bedtime. Apply one applicatorful to vagina at bedtime for 5 days 70 g 1   . naproxen (NAPROSYN) 500 MG tablet  Take 1 tablet (500 mg total) by mouth 2 (two) times daily. 30 tablet 0   . sulfamethoxazole-trimethoprim (BACTRIM DS) 800-160 MG per tablet Take 1 tablet by mouth 2 (two) times daily. 10 tablet 0   . traMADol (ULTRAM) 50 MG tablet Take 1 tablet (50 mg total) by mouth every 6 (six) hours as needed. 15 tablet 0     ROS Physical Exam   Blood pressure 105/56, pulse 64, temperature 98 F (36.7 C), temperature source Oral, resp. rate 18, height 5\' 6"  (1.676 m), weight 62.596 kg (138 lb), last menstrual period 08/17/2013, SpO2 100 %.  Physical Exam  Nursing note and vitals reviewed. Constitutional: She is oriented to person, place, and time. She appears well-developed and well-nourished. No distress.  Cardiovascular: Normal rate.   Respiratory: Effort normal.  GI: Soft. There is no tenderness. There is no rebound.  Neurological: She is alert and oriented to person, place, and time.  Skin: Skin is warm and dry.  Psychiatric: She has a normal mood and affect.    MAU Course  Procedures  The encounter diagnosis was UTI (lower urinary tract infection).  Results for orders placed or performed during the hospital encounter of 08/20/14 (from the past 24 hour(s))  Urinalysis, Routine w reflex  microscopic     Status: Abnormal   Collection Time: 08/20/14 11:51 PM  Result Value Ref Range   Color, Urine YELLOW YELLOW   APPearance HAZY (A) CLEAR   Specific Gravity, Urine 1.020 1.005 - 1.030   pH 8.0 5.0 - 8.0   Glucose, UA NEGATIVE NEGATIVE mg/dL   Hgb urine dipstick LARGE (A) NEGATIVE   Bilirubin Urine NEGATIVE NEGATIVE   Ketones, ur NEGATIVE NEGATIVE mg/dL   Protein, ur 30 (A) NEGATIVE mg/dL   Urobilinogen, UA 1.0 0.0 - 1.0 mg/dL   Nitrite NEGATIVE NEGATIVE   Leukocytes, UA SMALL (A) NEGATIVE  Urine microscopic-add on     Status: Abnormal   Collection Time: 08/20/14 11:51 PM  Result Value Ref Range   Squamous Epithelial / LPF FEW (A) RARE   WBC, UA 3-6 <3 WBC/hpf   RBC / HPF 7-10 <3  RBC/hpf   Bacteria, UA FEW (A) RARE   Urine-Other MUCOUS PRESENT   Pregnancy, urine POC     Status: None   Collection Time: 08/21/14 12:26 AM  Result Value Ref Range   Preg Test, Ur NEGATIVE NEGATIVE   D/W the patient, will wait to start ABX until after UC results are back  Assessment and Plan   1. UTI (lower urinary tract infection)    UC pending Will wait to start abx for UC results FU with urology PRN Return to MAU as needed  Follow-up Information    Follow up with [facility] [place].   Why:  As needed   Contact information:   [address] [facility] [address] [phone]       Tawnya Crook 08/21/2014, 12:52 AM

## 2014-08-22 LAB — URINE CULTURE: Colony Count: >=100000

## 2014-08-23 ENCOUNTER — Telehealth: Payer: Self-pay | Admitting: Medical

## 2014-08-23 DIAGNOSIS — N3000 Acute cystitis without hematuria: Secondary | ICD-10-CM

## 2014-08-23 MED ORDER — NITROFURANTOIN MONOHYD MACRO 100 MG PO CAPS: 100.0000 mg | ORAL_CAPSULE | Freq: Two times a day (BID) | ORAL | Status: DC

## 2014-08-23 NOTE — Telephone Encounter (Signed)
Called patient to discuss +UTI results on urine culture. Rx for Macrobid sent to patient's pharmacy. Patient advised to increase PO hydration as tolerated. Warning signs for pyelonephritis discussed. Patient voiced understanding.   Marny LowensteinJulie N Wenzel, PA-C 08/23/2014 8:53 AM

## 2014-08-27 ENCOUNTER — Emergency Department (HOSPITAL_COMMUNITY)
Admission: EM | Admit: 2014-08-27 | Discharge: 2014-08-27 | Disposition: A | Discharge status: Home / Self Care | Payer: BLUE CROSS/BLUE SHIELD | Attending: Emergency Medicine | Admitting: Emergency Medicine

## 2014-08-27 ENCOUNTER — Encounter (HOSPITAL_COMMUNITY): Payer: Self-pay | Admitting: Family Medicine

## 2014-08-27 DIAGNOSIS — Z87440 Personal history of urinary (tract) infections: Secondary | ICD-10-CM | POA: Diagnosis not present

## 2014-08-27 DIAGNOSIS — N739 Female pelvic inflammatory disease, unspecified: Secondary | ICD-10-CM

## 2014-08-27 DIAGNOSIS — Z7982 Long term (current) use of aspirin: Secondary | ICD-10-CM | POA: Insufficient documentation

## 2014-08-27 DIAGNOSIS — Z3202 Encounter for pregnancy test, result negative: Secondary | ICD-10-CM | POA: Diagnosis not present

## 2014-08-27 DIAGNOSIS — Z8659 Personal history of other mental and behavioral disorders: Secondary | ICD-10-CM | POA: Diagnosis not present

## 2014-08-27 DIAGNOSIS — Z720 Tobacco use: Secondary | ICD-10-CM | POA: Diagnosis not present

## 2014-08-27 DIAGNOSIS — Z791 Long term (current) use of non-steroidal anti-inflammatories (NSAID): Secondary | ICD-10-CM | POA: Insufficient documentation

## 2014-08-27 DIAGNOSIS — Z792 Long term (current) use of antibiotics: Secondary | ICD-10-CM | POA: Diagnosis not present

## 2014-08-27 DIAGNOSIS — R109 Unspecified abdominal pain: Secondary | ICD-10-CM | POA: Diagnosis present

## 2014-08-27 LAB — URINALYSIS, ROUTINE W REFLEX MICROSCOPIC
BILIRUBIN URINE: NEGATIVE
Glucose, UA: NEGATIVE mg/dL
Ketones, ur: NEGATIVE mg/dL
Nitrite: NEGATIVE
PROTEIN: NEGATIVE mg/dL
Specific Gravity, Urine: 1.011 (ref 1.005–1.030)
UROBILINOGEN UA: 0.2 mg/dL (ref 0.0–1.0)
pH: 6 (ref 5.0–8.0)

## 2014-08-27 LAB — WET PREP, GENITAL
TRICH WET PREP: NONE SEEN
Yeast Wet Prep HPF POC: NONE SEEN

## 2014-08-27 LAB — URINE MICROSCOPIC-ADD ON

## 2014-08-27 LAB — PREGNANCY, URINE: Preg Test, Ur: NEGATIVE

## 2014-08-27 MED ORDER — METRONIDAZOLE 500 MG PO TABS: 500.0000 mg | ORAL_TABLET | Freq: Two times a day (BID) | ORAL | Status: DC

## 2014-08-27 MED ORDER — DOXYCYCLINE HYCLATE 100 MG PO CAPS: 100.0000 mg | ORAL_CAPSULE | Freq: Two times a day (BID) | ORAL | Status: DC

## 2014-08-27 MED ORDER — METRONIDAZOLE 500 MG PO TABS
500.0000 mg | ORAL_TABLET | Freq: Once | ORAL | Status: AC
  Administered 2014-08-27: 500 mg via ORAL
  Filled 2014-08-27: qty 1

## 2014-08-27 MED ORDER — AZITHROMYCIN 250 MG PO TABS
1000.0000 mg | ORAL_TABLET | Freq: Once | ORAL | Status: AC
  Administered 2014-08-27: 1000 mg via ORAL
  Filled 2014-08-27: qty 4

## 2014-08-27 MED ORDER — CEFTRIAXONE SODIUM 250 MG IJ SOLR
250.0000 mg | Freq: Once | INTRAMUSCULAR | Status: AC
  Administered 2014-08-27: 250 mg via INTRAMUSCULAR
  Filled 2014-08-27: qty 250

## 2014-08-27 MED ORDER — IBUPROFEN 800 MG PO TABS
800.0000 mg | ORAL_TABLET | Freq: Once | ORAL | Status: AC
  Administered 2014-08-27: 800 mg via ORAL
  Filled 2014-08-27: qty 1

## 2014-08-27 MED ORDER — DOXYCYCLINE HYCLATE 100 MG PO TABS
100.0000 mg | ORAL_TABLET | Freq: Once | ORAL | Status: AC
  Administered 2014-08-27: 100 mg via ORAL
  Filled 2014-08-27: qty 1

## 2014-08-27 NOTE — Discharge Instructions (Signed)
Return to the emergency room with worsening of symptoms, new symptoms or with symptoms that are concerning, especially fevers, worsening abdominal pelvic pain, pain with urination, no improvement after 48-72 hours of antibiotic treatment. Do not have intercourse until you have completed your antibiotic course. Have your partners evaluated and/or treated for infection. Wait until you in her partner have completed antibiotic treatment for having intercourse to prevent reinfection.  Please take all of your antibiotics until finished!   You may develop abdominal discomfort or diarrhea from the antibiotic.  You may help offset this with probiotics which you can buy or get in yogurt. Do not eat  or take the probiotics until 2 hours after your antibiotic.  Call to make a follow-up appointment with [facility].  Repeat low information and follow recommendations.  Pelvic Inflammatory Disease Pelvic inflammatory disease (PID) refers to an infection in some or all of the female organs. The infection can be in the uterus, ovaries, fallopian tubes, or the surrounding tissues in the pelvis. PID can cause abdominal or pelvic pain that comes on suddenly (acute pelvic pain). PID is a serious infection because it can lead to lasting (chronic) pelvic pain or the inability to have children (infertile).  CAUSES  The infection is often caused by the normal bacteria found in the vaginal tissues. PID may also be caused by an infection that is spread during sexual contact. PID can also occur following:   The birth of a baby.   A miscarriage.   An abortion.   Major pelvic surgery.   The use of an intrauterine device (IUD).   A sexual assault.  RISK FACTORS Certain factors can put a person at higher risk for PID, such as:  Being younger than 25 years.  Being sexually active at [place] age.  Usingnonbarrier contraception.  Havingmultiple sexual partners.  Having sex with someone who has symptoms  of a genital infection.  Using oral contraception. Other times, certain behaviors can increase the possibility of getting PID, such as:  Having sex during your period.  Using a vaginal douche.  Having an intrauterine device (IUD) in place. SYMPTOMS   Abdominal or pelvic pain.   Fever.   Chills.   Abnormal vaginal discharge.  Abnormal uterine bleeding.   Unusual pain shortly after finishing your period. DIAGNOSIS  Your caregiver will choose some of the following methods to make a diagnosis, such as:   Performinga physical exam and history. A pelvic exam typically reveals a very tender uterus and surrounding pelvis.   Ordering laboratory tests including a pregnancy test, blood tests, and urine test.  Orderingcultures of the vagina and cervix to check for a sexually transmitted infection (STI).  Performing an ultrasound.   Performing a laparoscopic procedure to look inside the pelvis.  TREATMENT   Antibiotic medicines may be prescribed and taken by mouth.   Sexual partners may be treated when the infection is caused by a sexually transmitted disease (STD).   Hospitalization may be needed to give antibiotics intravenously.  Surgery may be needed, but this is rare. It may take weeks until you are completely well. If you are diagnosed with PID, you should also be checked for human immunodeficiency virus (HIV). HOME CARE INSTRUCTIONS   If given, take your antibiotics as directed. Finish the medicine even if you start to feel better.   Only take over-the-counter or prescription medicines for pain, discomfort, or fever as directed by your caregiver.   Do not have sexual intercourse until treatment is  completed or as directed by your caregiver. If PID is confirmed, your recent sexual partner(s) will need treatment.   Keep your follow-up appointments. SEEK MEDICAL CARE IF:   You have increased or abnormal vaginal discharge.   You need prescription  medicine for your pain.   You vomit.   You cannot take your medicines.   Your partner has an STD.  SEEK IMMEDIATE MEDICAL CARE IF:   You have a fever.   You have increased abdominal or pelvic pain.   You have chills.   You have pain when you urinate.   You are not better after 72 hours following treatment.  MAKE SURE YOU:   Understand these instructions.  Will watch your condition.  Will get help right away if you are not doing well or get worse. Document Released: 08/03/2005 Document Revised: 11/28/2012 Document Reviewed: 07/30/2011 [facility] Patient Information 2015 [employer] [place]. This information is not intended to replace advice given to you by your health care provider. Make sure you discuss any questions you have with your health care provider.

## 2014-08-27 NOTE — ED Provider Notes (Signed)
CSN: [id number]     Arrival date & time 08/27/14  1603 History   First MD Initiated Contact with Patient 08/27/14 1906     Chief Complaint  Patient presents with  . Abdominal Pain     (Consider location/radiation/quality/duration/timing/severity/associated sxs/prior Treatment) HPI  Dawn Proctor is a 19 y.o. female with PMH of anxiety, allergies presenting with intermittent crampy abdominal pain for 3 days. She has not taken anything for this pain. She also reports a thick colored vaginal discharge with foul odor. Patient without any form of contraception and does not use condoms. States this feels like last time she had a STD. She denies abdominal pain, fevers, chills, nausea, vomiting. She was evaluated at [facility] 3 days ago and diagnosed with urine tract infection. She was given Macrobid and has not started taking it. Patient reports normal stool and is not having any abdominal surgeries.   Past Medical History  Diagnosis Date  . Allergy   . Anxiety   . Seasonal allergies    Past Surgical History  Procedure Laterality Date  . I&d extremity Left 03/04/2014    Procedure: IRRIGATION AND DEBRIDEMENT KNEE ARTHROTOMY WITH REMOVAL OF BULLET FRAGMENTS;  Surgeon: Eldred Manges, MD;  Location: [facility] OR;  Service: Orthopedics;  Laterality: Left;   Family History  Problem Relation Age of Onset  . Emphysema Paternal Grandmother    History  Substance Use Topics  . Smoking status: Current Every Day Smoker -- 1.00 packs/day    Types: Cigarettes  . Smokeless tobacco: Never Used  . Alcohol Use: No   OB History    Gravida Para Term Preterm AB TAB SAB Ectopic Multiple Living   0              Review of Systems 10 Systems reviewed and are negative for acute change except as noted in the HPI.    Allergies  Orange fruit and Tomato  Home Medications   Prior to Admission medications   Medication Sig Start Date End Date Taking? Authorizing Provider  aspirin (BAYER ASPIRIN) 325 MG  tablet Take 1 tablet (325 mg total) by mouth daily. 03/06/14   Eldred Manges, MD  doxycycline (VIBRAMYCIN) 100 MG capsule Take 1 capsule (100 mg total) by mouth 2 (two) times daily. 08/27/14   Louann Sjogren, PA-C  ibuprofen (ADVIL,MOTRIN) 200 MG tablet Take 400 mg by mouth every 6 (six) hours as needed for mild pain.     Historical Provider, MD  metroNIDAZOLE (FLAGYL) 500 MG tablet Take 1 tablet (500 mg total) by mouth 2 (two) times daily. 08/27/14   Louann Sjogren, PA-C  metroNIDAZOLE (METROGEL) 0.75 % vaginal gel Place 1 Applicatorful vaginally at bedtime. Apply one applicatorful to vagina at bedtime for 5 days 05/14/14   Jacklyn Shell, CNM  naproxen (NAPROSYN) 500 MG tablet Take 1 tablet (500 mg total) by mouth 2 (two) times daily. 06/18/14   Antony Madura, PA-C  nitrofurantoin, macrocrystal-monohydrate, (MACROBID) 100 MG capsule Take 1 capsule (100 mg total) by mouth 2 (two) times daily. 08/23/14   Marny Lowenstein, PA-C  sulfamethoxazole-trimethoprim (BACTRIM DS) 800-160 MG per tablet Take 1 tablet by mouth 2 (two) times daily. 05/14/14   Jacklyn Shell, CNM  traMADol (ULTRAM) 50 MG tablet Take 1 tablet (50 mg total) by mouth every 6 (six) hours as needed. 06/18/14   Antony Madura, PA-C   BP 115/70 mmHg  Pulse 61  Temp(Src) 98.5 F (36.9 C)  Resp 18  SpO2 100%  LMP 08/17/2013 Physical Exam  Constitutional: She appears well-developed and well-nourished. No distress.  HENT:  Head: Normocephalic and atraumatic.  Mouth/Throat: Oropharynx is clear and moist.  Eyes: Conjunctivae and EOM are normal. Right eye exhibits no discharge. Left eye exhibits no discharge.  Cardiovascular: Normal rate, regular rhythm and normal heart sounds.   Pulmonary/Chest: Effort normal and breath sounds normal. No respiratory distress. She has no wheezes.  Abdominal: Soft. Bowel sounds are normal. She exhibits no distension. There is no tenderness.  Genitourinary:  Cervix erythematous and friable. Os  closed. No CMT left adnexal tenderness or masses appreciated bilaterally.. Large amount of thick opaque yellow discharge with foul odor. Nurse in room for exam.  Neurological: She is alert. She exhibits normal muscle tone. Coordination normal.  Skin: Skin is warm and dry. She is not diaphoretic.  Nursing note and vitals reviewed.   ED Course  Procedures (including critical care time) Labs Review Labs Reviewed  WET PREP, GENITAL - Abnormal; Notable for the following:    Clue Cells Wet Prep HPF POC MODERATE (*)    WBC, Wet Prep HPF POC TOO NUMEROUS TO COUNT (*)    All other components within normal limits  URINALYSIS, ROUTINE W REFLEX MICROSCOPIC - Abnormal; Notable for the following:    APPearance CLOUDY (*)    Hgb urine dipstick MODERATE (*)    Leukocytes, UA LARGE (*)    All other components within normal limits  URINE MICROSCOPIC-ADD ON - Abnormal; Notable for the following:    Squamous Epithelial / LPF FEW (*)    Bacteria, UA FEW (*)    All other components within normal limits  GC/CHLAMYDIA PROBE AMP  URINE CULTURE  PREGNANCY, URINE  RPR  HIV ANTIBODY (ROUTINE TESTING)    Imaging Review No results found.   EKG Interpretation None      MDM   Final diagnoses:  PID (pelvic inflammatory disease)   Patient presenting with crampy bilateral intermittent pelvic pain without any other abdominal symptoms. Patient also with white thick discharge. VSS. Patient with benign abdomen with pelvic exam with no CMT but left adnexal tenderness and wet prep with many to count white blood cells. Pt has been treated prophylacticly with azithromycin and rocephin due to pts history, pelvic exam, and wet prep. Will treat for PID with doxy for 14 days as well as flagyl. Pt has been advised to not drink alcohol while on this medication. Pt understands that they have GC/Chlamydia cultures pending and that they will need to inform all sexual partners if results return positive. She denies not to  have intercourse until her partners have been evaluated and treated and she has finished her antibiotic course. Discussed importance of using protection when sexually active. [facility]  Discussed return precautions with patient. Discussed all results and patient verbalizes understanding and agrees with plan.  Case has been discussed with Dr. Criss AlvineGoldston who agrees with the above plan and to discharge.     Louann SjogrenVictoria L Mays Paino, PA-C 08/27/14 2324  Audree CamelScott T Goldston, MD 08/27/14 [phone]

## 2014-08-27 NOTE — ED Notes (Signed)
Pt undressed, pelvic cart at bedside.

## 2014-08-27 NOTE — ED Notes (Signed)
Per pt sts pelvic pain x 3 days with vaginal discharge.

## 2014-08-28 LAB — GC/CHLAMYDIA PROBE AMP
CT Probe RNA: NEGATIVE
GC Probe RNA: NEGATIVE

## 2014-08-29 LAB — HIV ANTIBODY (ROUTINE TESTING W REFLEX)
HIV 1/O/2 Abs-Index Value: <1 (ref <1.00)
HIV-1/HIV-2 Ab: NONREACTIVE

## 2014-08-29 LAB — URINE CULTURE: Colony Count: >=100000

## 2014-08-29 LAB — RPR: RPR Ser Ql: NONREACTIVE

## 2014-08-30 ENCOUNTER — Telehealth (HOSPITAL_BASED_OUTPATIENT_CLINIC_OR_DEPARTMENT_OTHER): Payer: Self-pay | Admitting: Emergency Medicine

## 2014-08-30 NOTE — Telephone Encounter (Signed)
Post ED Visit - Positive Culture Follow-up  Culture report reviewed by antimicrobial stewardship pharmacist: []  Wes Dulaney, Pharm.D., BCPS []  Celedonio MiyamotoJeremy Frens, Pharm.D., BCPS []  Georgina PillionElizabeth Martin, [address] BCPS []  MantecaMinh Pham, [address]Ph[address]., BCPS, AAHIVP []  Estella HuskMichelle Turner, Pharm.D., BCPS, AAHIVP [x]  Elder CyphersLorie Poole, [address]Ph[address] BCPS  Positive urine culture Staph Treated with doxycycline and metronidazole, organism sensitive to the same and no further patient follow-up is required at this time.  Berle MullMiller, Darden Flemister 08/30/2014, 3:04 PM

## 2014-12-22 ENCOUNTER — Encounter (HOSPITAL_COMMUNITY): Payer: Self-pay | Admitting: *Deleted

## 2014-12-22 ENCOUNTER — Emergency Department (HOSPITAL_COMMUNITY)
Admission: EM | Admit: 2014-12-22 | Discharge: 2014-12-22 | Disposition: A | Discharge status: Home / Self Care | Payer: BLUE CROSS/BLUE SHIELD | Attending: Emergency Medicine | Admitting: Emergency Medicine

## 2014-12-22 DIAGNOSIS — Y998 Other external cause status: Secondary | ICD-10-CM | POA: Insufficient documentation

## 2014-12-22 DIAGNOSIS — Z791 Long term (current) use of non-steroidal anti-inflammatories (NSAID): Secondary | ICD-10-CM | POA: Diagnosis not present

## 2014-12-22 DIAGNOSIS — Z79899 Other long term (current) drug therapy: Secondary | ICD-10-CM | POA: Insufficient documentation

## 2014-12-22 DIAGNOSIS — H53149 Visual discomfort, unspecified: Secondary | ICD-10-CM | POA: Diagnosis not present

## 2014-12-22 DIAGNOSIS — H209 Unspecified iridocyclitis: Secondary | ICD-10-CM | POA: Insufficient documentation

## 2014-12-22 DIAGNOSIS — F419 Anxiety disorder, unspecified: Secondary | ICD-10-CM | POA: Insufficient documentation

## 2014-12-22 DIAGNOSIS — S0990XA Unspecified injury of head, initial encounter: Secondary | ICD-10-CM | POA: Insufficient documentation

## 2014-12-22 DIAGNOSIS — S0592XA Unspecified injury of left eye and orbit, initial encounter: Secondary | ICD-10-CM | POA: Diagnosis not present

## 2014-12-22 DIAGNOSIS — Z720 Tobacco use: Secondary | ICD-10-CM | POA: Diagnosis not present

## 2014-12-22 DIAGNOSIS — X58XXXA Exposure to other specified factors, initial encounter: Secondary | ICD-10-CM | POA: Diagnosis not present

## 2014-12-22 DIAGNOSIS — Z7982 Long term (current) use of aspirin: Secondary | ICD-10-CM | POA: Insufficient documentation

## 2014-12-22 DIAGNOSIS — Y9389 Activity, other specified: Secondary | ICD-10-CM | POA: Insufficient documentation

## 2014-12-22 DIAGNOSIS — H5712 Ocular pain, left eye: Secondary | ICD-10-CM

## 2014-12-22 DIAGNOSIS — Z792 Long term (current) use of antibiotics: Secondary | ICD-10-CM | POA: Diagnosis not present

## 2014-12-22 DIAGNOSIS — Y9289 Other specified places as the place of occurrence of the external cause: Secondary | ICD-10-CM | POA: Insufficient documentation

## 2014-12-22 MED ORDER — NEOMYCIN-POLYMYXIN-DEXAMETH 3.5-10000-0.1 OP SUSP: 2.0000 [drp] | Freq: Four times a day (QID) | OPHTHALMIC | Status: DC

## 2014-12-22 MED ORDER — FLUORESCEIN SODIUM 1 MG OP STRP
1.0000 | ORAL_STRIP | Freq: Once | OPHTHALMIC | Status: AC
  Administered 2014-12-22: 1 via OPHTHALMIC
  Filled 2014-12-22: qty 1

## 2014-12-22 MED ORDER — CYCLOPENTOLATE HCL 1 % OP SOLN: 1.0000 [drp] | Freq: Two times a day (BID) | OPHTHALMIC | Status: DC | PRN

## 2014-12-22 MED ORDER — TETRACAINE HCL 0.5 % OP SOLN
2.0000 [drp] | Freq: Once | OPHTHALMIC | Status: AC
  Administered 2014-12-22: 2 [drp] via OPHTHALMIC
  Filled 2014-12-22: qty 2

## 2014-12-22 NOTE — ED Notes (Signed)
Visual Acuity performed - pt w/no eye correction.

## 2014-12-22 NOTE — ED Notes (Signed)
Pt c/o left eye pain/injury. States was accidentally poked in left eye x 2 days ago. C/o pain and watering.

## 2014-12-22 NOTE — ED Provider Notes (Signed)
CSN: [id number]     Arrival date & time 12/22/14  [id number] History   First MD Initiated Contact with Patient 12/22/14 0825     Chief Complaint  Patient presents with  . Eye Pain    left     (Consider location/radiation/quality/duration/timing/severity/associated sxs/prior Treatment) HPI Comments: Dawn Proctor is a 19 y.o. female with a PMHx of seasonal allergies and anxiety, who presents to the ED with complaints of pain 2 days after she was accidentally poked in the left eye with some analysis finger. She reports that the pain is 10/10 constant aching in her left eye, nonradiating, worse with light exposure and blinking, and with no known alleviating factors given that she has not tried anything prior to arrival. She reports that since her eye began hurting she developed a mild headache from the eye pain, but this is not a concerning headache for her and is similar to prior headaches she's had. She endorses associated photophobia, some foreign body sensation, and conjunctival injection. She states that her eye has been tearing but denies any discharge from the eye. Denies any fevers or chills, eye itching, contact lens use, ocular discharge, vision changes, ear pain or drainage, rhinorrhea, sore throat, chest pain or shortness of breath, abdominal pain, nausea, vomiting, dysuria, hematuria, vaginal bleeding or discharge, numbness, tingling, weakness, arthralgias, myalgias, dizziness, lightheadedness, or vertigo.  Patient is a 19 y.o. female presenting with eye pain. The history is provided by the patient. No language interpreter was used.  Eye Pain This is a new problem. The current episode started in the past 7 days. The problem occurs constantly. The problem has been unchanged. Associated symptoms include headaches (mild, due to eye pain). Pertinent negatives include no abdominal pain, arthralgias, chest pain, chills, fever, myalgias, nausea, numbness, sore throat, vomiting or weakness.  Exacerbated by: lights and blinking. She has tried nothing for the symptoms. The treatment provided no relief.    Past Medical History  Diagnosis Date  . Allergy   . Anxiety   . Seasonal allergies    Past Surgical History  Procedure Laterality Date  . I&d extremity Left 03/04/2014    Procedure: IRRIGATION AND DEBRIDEMENT KNEE ARTHROTOMY WITH REMOVAL OF BULLET FRAGMENTS;  Surgeon: Eldred MangesMark C Yates, MD;  Location: [facility] OR;  Service: Orthopedics;  Laterality: Left;   Family History  Problem Relation Age of Onset  . Emphysema Paternal Grandmother    History  Substance Use Topics  . Smoking status: Current Every Day Smoker -- 0.50 packs/day    Types: Cigarettes  . Smokeless tobacco: Never Used  . Alcohol Use: No   OB History    Gravida Para Term Preterm AB TAB SAB Ectopic Multiple Living   0              Review of Systems  Constitutional: Negative for fever and chills.  HENT: Negative for ear discharge, ear pain, rhinorrhea and sore throat.   Eyes: Positive for photophobia, pain and redness. Negative for discharge, itching and visual disturbance.  Respiratory: Negative for shortness of breath.   Cardiovascular: Negative for chest pain.  Gastrointestinal: Negative for nausea, vomiting and abdominal pain.  Genitourinary: Negative for dysuria, hematuria, vaginal bleeding and vaginal discharge.  Musculoskeletal: Negative for myalgias and arthralgias.  Skin: Negative for color change.  Allergic/Immunologic: Negative for immunocompromised state.  Neurological: Positive for headaches (mild, due to eye pain). Negative for dizziness, weakness, light-headedness and numbness.  Psychiatric/Behavioral: Negative for confusion.   10 Systems reviewed and are negative  for acute change except as noted in the HPI.    Allergies  Orange fruit and Tomato  Home Medications   Prior to Admission medications   Medication Sig Start Date End Date Taking? Authorizing Provider  aspirin (BAYER ASPIRIN)  325 MG tablet Take 1 tablet (325 mg total) by mouth daily. 03/06/14   Eldred MangesMark C Yates, MD  doxycycline (VIBRAMYCIN) 100 MG capsule Take 1 capsule (100 mg total) by mouth 2 (two) times daily. 08/27/14   Oswaldo ConroyVictoria Creech, PA-C  ibuprofen (ADVIL,MOTRIN) 200 MG tablet Take 400 mg by mouth every 6 (six) hours as needed for mild pain.     Historical Provider, MD  metroNIDAZOLE (FLAGYL) 500 MG tablet Take 1 tablet (500 mg total) by mouth 2 (two) times daily. 08/27/14   Oswaldo ConroyVictoria Creech, PA-C  metroNIDAZOLE (METROGEL) 0.75 % vaginal gel Place 1 Applicatorful vaginally at bedtime. Apply one applicatorful to vagina at bedtime for 5 days 05/14/14   Jacklyn ShellFrances Cresenzo-Dishmon, CNM  naproxen (NAPROSYN) 500 MG tablet Take 1 tablet (500 mg total) by mouth 2 (two) times daily. 06/18/14   Antony MaduraKelly Humes, PA-C  nitrofurantoin, macrocrystal-monohydrate, (MACROBID) 100 MG capsule Take 1 capsule (100 mg total) by mouth 2 (two) times daily. 08/23/14   Marny LowensteinJulie N Wenzel, PA-C  sulfamethoxazole-trimethoprim (BACTRIM DS) 800-160 MG per tablet Take 1 tablet by mouth 2 (two) times daily. 05/14/14   Jacklyn ShellFrances Cresenzo-Dishmon, CNM  traMADol (ULTRAM) 50 MG tablet Take 1 tablet (50 mg total) by mouth every 6 (six) hours as needed. 06/18/14   Antony MaduraKelly Humes, PA-C   BP 119/67 mmHg  Pulse 81  Temp(Src) 98.6 F (37 C) (Oral)  Resp 16  Ht 5\' 6"  (1.676 m)  Wt 138 lb (62.596 kg)  BMI 22.28 kg/m2  SpO2 100%  LMP 12/01/2014 (Exact Date) Physical Exam  Constitutional: She is oriented to person, place, and time. Vital signs are normal. She appears well-developed and well-nourished.  Non-toxic appearance. She appears distressed.  Afebrile, nontoxic, appears uncomfortable with lights on, sunglasses on and lights dimmed  HENT:  Head: Normocephalic and atraumatic.  Nose: Nose normal.  Mouth/Throat: Mucous membranes are normal.  Eyes: EOM are normal. Lids are everted and swept, no foreign bodies found. Right eye exhibits no discharge. No foreign body present in  the right eye. Left eye exhibits no discharge. No foreign body present in the left eye. Left conjunctiva is injected. Left conjunctiva has no hemorrhage.  Lids without chemosis, no periorbital edema, no FBs noted with eversion of eyelids. No ocular discharge, some tearing during exam. L eye with diffuse conjunctival injection and perilimbal flush, no hemorrhage. EOMI. L pupil miotic and poorly reactive. +Consensual pain. No fluorescein uptake on wood's lamp exam of L eye, no abrasions noted. No hyphema or hypopion noted, although very difficult exam of chambers due to pt discomfort with slit lamp exam. Visual acuity 20/15 bilateral, 20/15 R eye, 20/15 L eye.   Neck: Normal range of motion. Neck supple.  Cardiovascular: Normal rate.   Pulmonary/Chest: Effort normal. No respiratory distress.  Abdominal: Normal appearance. She exhibits no distension.  Musculoskeletal: Normal range of motion.  Neurological: She is alert and oriented to person, place, and time. She has normal strength. No sensory deficit.  Skin: Skin is warm, dry and intact. No rash noted.  Psychiatric: She has a normal mood and affect. Her behavior is normal.  Nursing note and vitals reviewed.   ED Course  Procedures (including critical care time) Labs Review Labs Reviewed - No data to display  Imaging Review No results found.   EKG Interpretation None      MDM   Final diagnoses:  Traumatic iritis  Eye pain, left    19 y.o. female here with likely iritis, likely traumatic since pt had been poked in the eye prior to onset. +consensual pain, photophobia, miotic and poorly reactive pupil, perilimbal flush and diffuse injection. No uptake on fluorescein. Very difficult to examine posterior chamber due to pain during exam and miotic eye. Given that it's Saturday, and pt would not be able to f/up in 24hrs, will consult ophthalmologist now.   9:10 AM Dr. Alvino Chapel of ophthalmology returning page. Would like for her to be started  on Maxitrol and Cyclogel, and he will see her in the office tomorrow at 7:30am. Pt understands instructions and agrees. Will d/c home with these instructions. I explained the diagnosis and have given explicit precautions to return to the ER including for any other new or worsening symptoms. The patient understands and accepts the medical plan as it's been dictated and I have answered their questions. Discharge instructions concerning home care and prescriptions have been given. The patient is STABLE and is discharged to home in good condition.  BP 119/67 mmHg  Pulse 81  Temp(Src) 98.6 F (37 C) (Oral)  Resp 16  Ht  (1.676 m)  Wt 138 lb (62.596 kg)  BMI 22.28 kg/m2  SpO2 100%  LMP 12/01/2014 (Exact Date)  Meds ordered this encounter  Medications  . fluorescein ophthalmic strip 1 strip    Sig:   . tetracaine (PONTOCAINE) 0.5 % ophthalmic solution 2 drop    Sig:   . neomycin-polymyxin b-dexamethasone (MAXITROL) 3.5-10000-0.1 SUSP    Sig: Place 2 drops into the left eye every 6 (six) hours. Place two drops into the L eye 4 times daily (q6h)    Dispense:  1 Bottle    Refill:  0    Order Specific Question:  Supervising Provider    Answer:  MILLER, BRIAN [[id number]]  . cyclopentolate (CYCLOGYL) 1 % ophthalmic solution    Sig: Place 1 drop into the left eye 2 (two) times daily as needed (eye pain).    Dispense:  5 mL    Refill:  0    Order Specific Question:  Supervising Provider    Answer:  Eber Hong [[id number]]     Shelby Peltz Camprubi-Soms, PA-C 12/22/14 2951  Gerhard Munch, MD 12/23/14 2246

## 2014-12-22 NOTE — Discharge Instructions (Signed)
Your eye pain is due to a condition called Iritis, inflammation of the iris. Use maxitrol and cyclogyl as directed. Follow up with Dr. Alvino Chapelhoi (eye doctor) tomorrow at 7:30 am at his office (listed above). Return to the ER for changes or worsening symptoms.   Iritis Iritis is when the colored part of the eye (iris) is red and painful (inflamed). Light might seem to hurt your eyes (light sensitivity). You may have blurred vision or start to tear up. It is important to treat this problem early.  HOME CARE  Use eyedrops or pills (corticosteroid medicine) as told by your doctor.  Only take medicine as told by your doctor. GET HELP RIGHT AWAY IF:   You have redness in one or both eyes.  Light seems to hurt your eyes.  You have pain in one or both eyes. MAKE SURE YOU:   Understand these instructions.  Will watch your condition.  Will get help right away if you are not doing well or get worse. Document Released: 10/28/2009 Document Revised: 10/26/2011 Document Reviewed: 10/28/2009 [facility] Patient Information 2015 [place], [place]. This information is not intended to replace advice given to you by your health care provider. Make sure you discuss any questions you have with your health care provider.

## 2015-01-14 ENCOUNTER — Encounter (HOSPITAL_COMMUNITY): Payer: Self-pay | Admitting: *Deleted

## 2015-01-14 ENCOUNTER — Inpatient Hospital Stay (HOSPITAL_COMMUNITY): Payer: BLUE CROSS/BLUE SHIELD

## 2015-01-14 ENCOUNTER — Inpatient Hospital Stay (HOSPITAL_COMMUNITY)
Admission: AD | Admit: 2015-01-14 | Discharge: 2015-01-14 | Disposition: A | Discharge status: Home / Self Care | Payer: BLUE CROSS/BLUE SHIELD | Source: Ambulatory Visit | Attending: Obstetrics and Gynecology | Admitting: Obstetrics and Gynecology

## 2015-01-14 DIAGNOSIS — O26899 Other specified pregnancy related conditions, unspecified trimester: Secondary | ICD-10-CM

## 2015-01-14 DIAGNOSIS — O99011 Anemia complicating pregnancy, first trimester: Secondary | ICD-10-CM | POA: Insufficient documentation

## 2015-01-14 DIAGNOSIS — F17210 Nicotine dependence, cigarettes, uncomplicated: Secondary | ICD-10-CM | POA: Diagnosis not present

## 2015-01-14 DIAGNOSIS — R109 Unspecified abdominal pain: Secondary | ICD-10-CM | POA: Diagnosis present

## 2015-01-14 DIAGNOSIS — O99331 Smoking (tobacco) complicating pregnancy, first trimester: Secondary | ICD-10-CM | POA: Diagnosis not present

## 2015-01-14 DIAGNOSIS — Z880 Allergy status to penicillin: Secondary | ICD-10-CM | POA: Insufficient documentation

## 2015-01-14 DIAGNOSIS — Z3A01 Less than 8 weeks gestation of pregnancy: Secondary | ICD-10-CM | POA: Diagnosis not present

## 2015-01-14 DIAGNOSIS — Z3490 Encounter for supervision of normal pregnancy, unspecified, unspecified trimester: Secondary | ICD-10-CM

## 2015-01-14 DIAGNOSIS — O9989 Other specified diseases and conditions complicating pregnancy, childbirth and the puerperium: Secondary | ICD-10-CM | POA: Insufficient documentation

## 2015-01-14 LAB — URINALYSIS, ROUTINE W REFLEX MICROSCOPIC
BILIRUBIN URINE: NEGATIVE
Glucose, UA: NEGATIVE mg/dL
HGB URINE DIPSTICK: NEGATIVE
KETONES UR: NEGATIVE mg/dL
Leukocytes, UA: NEGATIVE
Nitrite: NEGATIVE
Protein, ur: NEGATIVE mg/dL
SPECIFIC GRAVITY, URINE: 1.025 (ref 1.005–1.030)
UROBILINOGEN UA: 0.2 mg/dL (ref 0.0–1.0)
pH: 6 (ref 5.0–8.0)

## 2015-01-14 LAB — CBC
HCT: 32.9 % — ABNORMAL LOW (ref 36.0–46.0)
Hemoglobin: 10.4 g/dL — ABNORMAL LOW (ref 12.0–15.0)
MCH: 22.4 pg — ABNORMAL LOW (ref 26.0–34.0)
MCHC: 31.6 g/dL (ref 30.0–36.0)
MCV: 70.9 fL — ABNORMAL LOW (ref 78.0–100.0)
Platelets: 328 10*3/uL (ref 150–400)
RBC: 4.64 MIL/uL (ref 3.87–5.11)
RDW: 14 % (ref 11.5–15.5)
WBC: 4.7 10*3/uL (ref 4.0–10.5)

## 2015-01-14 LAB — POCT PREGNANCY, URINE: Preg Test, Ur: POSITIVE — AB

## 2015-01-14 LAB — HCG, QUANTITATIVE, PREGNANCY: hCG, Beta Chain, Quant, S: 14154 m[IU]/mL — ABNORMAL HIGH (ref <5)

## 2015-01-14 NOTE — MAU Provider Note (Signed)
History     CSN: [id number]  Arrival date and time: 01/14/15 1123   First Provider Initiated Contact with Patient 01/14/15 1441      Chief Complaint  Patient presents with  . Abdominal Pain  . Dizziness   HPI   Ms. Ezequiel KayserBrianna M Canevari is a 19 y.o. G2P0010 at 6559w0d who presents with abdominal pain. She started experiencing the pain 4-5 days ago; the pain comes and goes. She has not tried anything over the counter. The pain is located in the bottom of her stomach and is cramp like pain.   OB History    Gravida Para Term Preterm AB TAB SAB Ectopic Multiple Living   2    1 1           Past Medical History  Diagnosis Date  . Allergy   . Anxiety   . Seasonal allergies     Past Surgical History  Procedure Laterality Date  . I&d extremity Left 03/04/2014    Procedure: IRRIGATION AND DEBRIDEMENT KNEE ARTHROTOMY WITH REMOVAL OF BULLET FRAGMENTS;  Surgeon: Eldred MangesMark C Yates, MD;  Location: [facility] OR;  Service: Orthopedics;  Laterality: Left;    Family History  Problem Relation Age of Onset  . Emphysema Paternal Grandmother     History  Substance Use Topics  . Smoking status: Current Every Day Smoker -- 0.50 packs/day    Types: Cigarettes  . Smokeless tobacco: Never Used  . Alcohol Use: No    Allergies:  Allergies  Allergen Reactions  . Orange Fruit [Citrus] Swelling    Swelling around the lips  . Penicillins Hives  . Tomato Swelling    Swelling around the lips    Prescriptions prior to admission  Medication Sig Dispense Refill Last Dose  . aspirin (BAYER ASPIRIN) 325 MG tablet Take 1 tablet (325 mg total) by mouth daily. (Patient not taking: Reported on 01/14/2015) 30 tablet 0 Past Month at Unknown time  . cyclopentolate (CYCLOGYL) 1 % ophthalmic solution Place 1 drop into the left eye 2 (two) times daily as needed (eye pain). (Patient not taking: Reported on 01/14/2015) 5 mL 0   . doxycycline (VIBRAMYCIN) 100 MG capsule Take 1 capsule (100 mg total) by mouth 2 (two) times  daily. (Patient not taking: Reported on 01/14/2015) 28 capsule 0   . metroNIDAZOLE (FLAGYL) 500 MG tablet Take 1 tablet (500 mg total) by mouth 2 (two) times daily. (Patient not taking: Reported on 01/14/2015) 14 tablet 0   . metroNIDAZOLE (METROGEL) 0.75 % vaginal gel Place 1 Applicatorful vaginally at bedtime. Apply one applicatorful to vagina at bedtime for 5 days (Patient not taking: Reported on 01/14/2015) 70 g 1   . naproxen (NAPROSYN) 500 MG tablet Take 1 tablet (500 mg total) by mouth 2 (two) times daily. (Patient not taking: Reported on 01/14/2015) 30 tablet 0   . neomycin-polymyxin b-dexamethasone (MAXITROL) 3.5-10000-0.1 SUSP Place 2 drops into the left eye every 6 (six) hours. Place two drops into the L eye 4 times daily (q6h) (Patient not taking: Reported on 01/14/2015) 1 Bottle 0   . nitrofurantoin, macrocrystal-monohydrate, (MACROBID) 100 MG capsule Take 1 capsule (100 mg total) by mouth 2 (two) times daily. (Patient not taking: Reported on 01/14/2015) 14 capsule 0   . sulfamethoxazole-trimethoprim (BACTRIM DS) 800-160 MG per tablet Take 1 tablet by mouth 2 (two) times daily. (Patient not taking: Reported on 01/14/2015) 10 tablet 0   . traMADol (ULTRAM) 50 MG tablet Take 1 tablet (50 mg total) by mouth every  6 (six) hours as needed. (Patient not taking: Reported on 01/14/2015) 15 tablet 0    Results for orders placed or performed during the hospital encounter of 01/14/15 (from the past 48 hour(s))  Urinalysis, Routine w reflex microscopic (not at [facility])     Status: None   Collection Time: 01/14/15 11:40 AM  Result Value Ref Range   Color, Urine YELLOW YELLOW   APPearance CLEAR CLEAR   Specific Gravity, Urine 1.025 1.005 - 1.030   pH 6.0 5.0 - 8.0   Glucose, UA NEGATIVE NEGATIVE mg/dL   Hgb urine dipstick NEGATIVE NEGATIVE   Bilirubin Urine NEGATIVE NEGATIVE   Ketones, ur NEGATIVE NEGATIVE mg/dL   Protein, ur NEGATIVE NEGATIVE mg/dL   Urobilinogen, UA 0.2 0.0 - 1.0 mg/dL   Nitrite  NEGATIVE NEGATIVE   Leukocytes, UA NEGATIVE NEGATIVE    Comment: MICROSCOPIC NOT DONE ON URINES WITH NEGATIVE PROTEIN, BLOOD, LEUKOCYTES, NITRITE, OR GLUCOSE <1000 mg/dL.  Pregnancy, urine POC     Status: Abnormal   Collection Time: 01/14/15 11:47 AM  Result Value Ref Range   Preg Test, Ur POSITIVE (A) NEGATIVE    Comment:        THE SENSITIVITY OF THIS METHODOLOGY IS >24 mIU/mL   CBC     Status: Abnormal   Collection Time: 01/14/15  1:29 PM  Result Value Ref Range   WBC 4.7 4.0 - 10.5 K/uL   RBC 4.64 3.87 - 5.11 MIL/uL   Hemoglobin 10.4 (L) 12.0 - 15.0 g/dL   HCT 16.1 (L) 09.6 - 04.5 %   MCV 70.9 (L) 78.0 - 100.0 fL   MCH 22.4 (L) 26.0 - 34.0 pg   MCHC 31.6 30.0 - 36.0 g/dL   RDW 40.9 81.1 - 91.4 %   Platelets 328 150 - 400 K/uL  hCG, quantitative, pregnancy     Status: Abnormal   Collection Time: 01/14/15  1:29 PM  Result Value Ref Range   hCG, Beta Chain, Quant, S 14154 (H) <5 mIU/mL    Comment:          GEST. AGE      CONC.  (mIU/mL)   <=1 WEEK        5 - 50     2 WEEKS       50 - 500     3 WEEKS       100 - 10,000     4 WEEKS     1,000 - 30,000     5 WEEKS     3,500 - 115,000   6-8 WEEKS     12,000 - 270,000    12 WEEKS     15,000 - 220,000        FEMALE AND NON-PREGNANT FEMALE:     LESS THAN 5 mIU/mL    US Ob Comp Less 14 Wks  01/14/2015   CLINICAL DATA:  Pregnant, pain  EXAM: OBSTETRIC <14 WK [place] AND TRANSVAGINAL OB US  TECHNIQUE: Both transabdominal and transvaginal ultrasound examinations were performed for complete evaluation of the gestation as well as the maternal uterus, adnexal regions, and pelvic cul-de-sac. Transvaginal technique was performed to assess early pregnancy.  COMPARISON:  None.  FINDINGS: Intrauterine gestational sac: Visualized/normal in shape.  Yolk sac:  Present  Embryo:  Not visualized  MSD: 10.1  mm   5 w   5  d  Maternal uterus/adnexae: No subchronic hemorrhage.  Bilateral ovaries are within normal limits.  No free fluid.  IMPRESSION: Single  intrauterine gestational  sac with yolk sac, measuring 5 weeks 5 days by mean sac diameter.  No fetal pole is visualized.  Consider follow-up pelvic ultrasound in 2 weeks to confirm viability.   Electronically Signed   By: Charline Bills M.D.   On: 01/14/2015 14:31   US Ob Transvaginal  01/14/2015   CLINICAL DATA:  Pregnant, pain  EXAM: OBSTETRIC <14 WK [place] AND TRANSVAGINAL OB US  TECHNIQUE: Both transabdominal and transvaginal ultrasound examinations were performed for complete evaluation of the gestation as well as the maternal uterus, adnexal regions, and pelvic cul-de-sac. Transvaginal technique was performed to assess early pregnancy.  COMPARISON:  None.  FINDINGS: Intrauterine gestational sac: Visualized/normal in shape.  Yolk sac:  Present  Embryo:  Not visualized  MSD: 10.1  mm   5 w   5  d  Maternal uterus/adnexae: No subchronic hemorrhage.  Bilateral ovaries are within normal limits.  No free fluid.  IMPRESSION: Single intrauterine gestational sac with yolk sac, measuring 5 weeks 5 days by mean sac diameter.  No fetal pole is visualized.  Consider follow-up pelvic ultrasound in 2 weeks to confirm viability.   Electronically Signed   By: Charline Bills M.D.   On: 01/14/2015 14:31    Review of Systems  Constitutional: Negative for fever and chills.  Genitourinary: Negative for dysuria.       Denies abnormal vaginal discharge. Denies vaginal bleeding    Physical Exam   Blood pressure 102/55, pulse 65, temperature 98.2 F (36.8 C), temperature source Oral, resp. rate 16, height  (1.676 m), weight 61.689 kg (136 lb), last menstrual period 12/03/2014.  Physical Exam  Constitutional: She is oriented to person, place, and time. She appears well-developed and well-nourished. No distress.  HENT:  Head: Normocephalic.  Eyes: Pupils are equal, round, and reactive to light.  Respiratory: Effort normal.  GI: Soft. Normal appearance. There is tenderness in the suprapubic area. There is no  rigidity and no guarding.  Musculoskeletal: Normal range of motion.  Neurological: She is alert and oriented to person, place, and time.  Skin: Skin is warm. She is not diaphoretic.  Psychiatric: Her behavior is normal.    MAU Course  Procedures  MDM   Assessment and Plan   A: Anemia in pregnancy  SIUP @ [redacted]w[redacted]d with yolk sac  Abdominal pain in pregnancy   P: Discharge home in stable condition Start prenatal care Pregnancy verification letter given  Return to MAU if symptoms worsen  First trimester warning signs discussed   Duane Lope, NP 01/14/2015 3:03 PM

## 2015-01-14 NOTE — MAU Note (Signed)
Lower abd & pelvic pain for the past 3-4 days, denies bleeding or discharge, no fever.

## 2015-01-14 NOTE — Discharge Instructions (Signed)

## 2015-01-14 NOTE — MAU Note (Signed)
Patient reports being at work feeling dizzy felt faint did not pass completely out and was caught by a Radio broadcast assistantcoworker.  Having abdominal cramping.

## 2015-01-15 LAB — HIV ANTIBODY (ROUTINE TESTING W REFLEX): HIV Screen 4th Generation wRfx: NONREACTIVE

## 2015-01-30 ENCOUNTER — Inpatient Hospital Stay (HOSPITAL_COMMUNITY)
Admission: AD | Admit: 2015-01-30 | Discharge: 2015-01-30 | Disposition: A | Discharge status: Home / Self Care | Payer: BLUE CROSS/BLUE SHIELD | Source: Ambulatory Visit | Attending: Obstetrics and Gynecology | Admitting: Obstetrics and Gynecology

## 2015-01-30 ENCOUNTER — Encounter (HOSPITAL_COMMUNITY): Payer: Self-pay | Admitting: *Deleted

## 2015-01-30 DIAGNOSIS — Z87891 Personal history of nicotine dependence: Secondary | ICD-10-CM | POA: Diagnosis not present

## 2015-01-30 DIAGNOSIS — O99011 Anemia complicating pregnancy, first trimester: Secondary | ICD-10-CM | POA: Insufficient documentation

## 2015-01-30 DIAGNOSIS — Z3A08 8 weeks gestation of pregnancy: Secondary | ICD-10-CM | POA: Insufficient documentation

## 2015-01-30 DIAGNOSIS — O99019 Anemia complicating pregnancy, unspecified trimester: Secondary | ICD-10-CM

## 2015-01-30 DIAGNOSIS — R42 Dizziness and giddiness: Secondary | ICD-10-CM | POA: Diagnosis not present

## 2015-01-30 LAB — URINALYSIS, ROUTINE W REFLEX MICROSCOPIC
BILIRUBIN URINE: NEGATIVE
Glucose, UA: NEGATIVE mg/dL
Hgb urine dipstick: NEGATIVE
KETONES UR: NEGATIVE mg/dL
Leukocytes, UA: NEGATIVE
Nitrite: NEGATIVE
PH: 6 (ref 5.0–8.0)
Protein, ur: NEGATIVE mg/dL
Specific Gravity, Urine: 1.025 (ref 1.005–1.030)
Urobilinogen, UA: 0.2 mg/dL (ref 0.0–1.0)

## 2015-01-30 LAB — CBC
HCT: 29.7 % — ABNORMAL LOW (ref 36.0–46.0)
Hemoglobin: 9.8 g/dL — ABNORMAL LOW (ref 12.0–15.0)
MCH: 23 pg — AB (ref 26.0–34.0)
MCHC: 33 g/dL (ref 30.0–36.0)
MCV: 69.6 fL — AB (ref 78.0–100.0)
PLATELETS: 319 10*3/uL (ref 150–400)
RBC: 4.27 MIL/uL (ref 3.87–5.11)
RDW: 13.7 % (ref 11.5–15.5)
WBC: 5.2 10*3/uL (ref 4.0–10.5)

## 2015-01-30 LAB — GLUCOSE, CAPILLARY: GLUCOSE-CAPILLARY: 84 mg/dL (ref 65–99)

## 2015-01-30 MED ORDER — FERROUS SULFATE 325 (65 FE) MG PO TABS: 325.0000 mg | ORAL_TABLET | Freq: Two times a day (BID) | ORAL | Status: DC

## 2015-01-30 NOTE — MAU Note (Signed)
Waiting on medicaid, doesn't have a doctor yet.  Been dizzy, gets flushed, feels like she is going to pass out.  Pain in lower stomach, only happens when this is going on.

## 2015-01-30 NOTE — Discharge Instructions (Signed)
Dizziness °Dizziness is a common problem. It is a feeling of unsteadiness or light-headedness. You may feel like you are about to faint. Dizziness can lead to injury if you stumble or fall. A person of any age group can suffer from dizziness, but dizziness is more common in older adults. °CAUSES  °Dizziness can be caused by many different things, including: °· Middle ear problems. °· Standing for too long. °· Infections. °· An allergic reaction. °· Aging. °· An emotional response to something, such as the sight of blood. °· Side effects of medicines. °· Tiredness. °· Problems with circulation or blood pressure. °· Excessive use of alcohol or medicines, or illegal drug use. °· Breathing too fast (hyperventilation). °· An irregular heart rhythm (arrhythmia). °· A low red blood cell count (anemia). °· Pregnancy. °· Vomiting, diarrhea, fever, or other illnesses that cause body fluid loss (dehydration). °· Diseases or conditions such as Parkinson's disease, high blood pressure (hypertension), diabetes, and thyroid problems. °· Exposure to extreme heat. °DIAGNOSIS  °Your health care provider will ask about your symptoms, perform a physical exam, and perform an electrocardiogram (ECG) to record the electrical activity of your heart. Your health care provider may also perform other heart or blood tests to determine the cause of your dizziness. These may include: °· Transthoracic echocardiogram (TTE). During echocardiography, sound waves are used to evaluate how blood flows through your heart. °· Transesophageal echocardiogram (TEE). °· Cardiac monitoring. This allows your health care provider to monitor your heart rate and rhythm in real time. °· Holter monitor. This is a portable device that records your heartbeat and can help diagnose heart arrhythmias. It allows your health care provider to track your heart activity for several days if needed. °· Stress tests by exercise or by giving medicine that makes the heart beat  faster. °TREATMENT  °Treatment of dizziness depends on the cause of your symptoms and can vary greatly. °HOME CARE INSTRUCTIONS  °· Drink enough fluids to keep your urine clear or pale yellow. This is especially important in very hot weather. In older adults, it is also important in cold weather. °· Take your medicine exactly as directed if your dizziness is caused by medicines. When taking blood pressure medicines, it is especially important to get up slowly. °· Rise slowly from chairs and steady yourself until you feel okay. °· In the morning, first sit up on the side of the bed. When you feel okay, stand slowly while holding onto something until you know your balance is fine. °· Move your legs often if you need to stand in one place for a long time. Tighten and relax your muscles in your legs while standing. °· Have someone stay with you for 1-2 days if dizziness continues to be a problem. Do this until you feel you are well enough to stay alone. Have the person call your health care provider if he or she notices changes in you that are concerning. °· Do not drive or use heavy machinery if you feel dizzy. °· Do not drink alcohol. °SEEK IMMEDIATE MEDICAL CARE IF:  °· Your dizziness or light-headedness gets worse. °· You feel nauseous or vomit. °· You have problems talking, walking, or using your arms, hands, or legs. °· You feel weak. °· You are not thinking clearly or you have trouble forming sentences. It may take a friend or family member to notice this. °· You have chest pain, abdominal pain, shortness of breath, or sweating. °· Your vision changes. °· You notice   any bleeding.  You have side effects from medicine that seems to be getting worse rather than better. MAKE SURE YOU:   Understand these instructions.  Will watch your condition.  Will get help right away if you are not doing well or get worse. Document Released: 01/27/2001 Document Revised: 08/08/2013 Document Reviewed: 02/20/2011 [facility]  Patient Information 2015 [place], [place]. This information is not intended to replace advice given to you by your health care provider. Make sure you discuss any questions you have with your health care provider.  Anemia, Nonspecific Anemia is a condition in which the concentration of red blood cells or hemoglobin in the blood is below normal. Hemoglobin is a substance in red blood cells that carries oxygen to the tissues of the body. Anemia results in not enough oxygen reaching these tissues.  CAUSES  Common causes of anemia include:   Excessive bleeding. Bleeding may be internal or external. This includes excessive bleeding from periods (in women) or from the intestine.   Poor nutrition.   Chronic kidney, thyroid, and liver disease.  Bone marrow disorders that decrease red blood cell production.  Cancer and treatments for cancer.  HIV, AIDS, and their treatments.  Spleen problems that increase red blood cell destruction.  Blood disorders.  Excess destruction of red blood cells due to infection, medicines, and autoimmune disorders. SIGNS AND SYMPTOMS   Minor weakness.   Dizziness.   Headache.  Palpitations.   Shortness of breath, especially with exercise.   Paleness.  Cold sensitivity.  Indigestion.  Nausea.  Difficulty sleeping.  Difficulty concentrating. Symptoms may occur suddenly or they may develop slowly.  DIAGNOSIS  Additional blood tests are often needed. These help your health care provider determine the best treatment. Your health care provider will check your stool for blood and look for other causes of blood loss.  TREATMENT  Treatment varies depending on the cause of the anemia. Treatment can include:   Supplements of iron, vitamin B12, or folic acid.   Hormone medicines.   A blood transfusion. This may be needed if blood loss is severe.   Hospitalization. This may be needed if there is significant continual blood loss.   Dietary  changes.  Spleen removal. HOME CARE INSTRUCTIONS Keep all follow-up appointments. It often takes many weeks to correct anemia, and having your health care provider check on your condition and your response to treatment is very important. SEEK IMMEDIATE MEDICAL CARE IF:   You develop extreme weakness, shortness of breath, or chest pain.   You become dizzy or have trouble concentrating.  You develop heavy vaginal bleeding.   You develop a rash.   You have bloody or black, tarry stools.   You faint.   You vomit up blood.   You vomit repeatedly.   You have abdominal pain.  You have a fever or persistent symptoms for more than 2-3 days.   You have a fever and your symptoms suddenly get worse.   You are dehydrated.  MAKE SURE YOU:  Understand these instructions.  Will watch your condition.  Will get help right away if you are not doing well or get worse. Document Released: 09/10/2004 Document Revised: 04/05/2013 Document Reviewed: 01/27/2013 [facility] Patient Information 2015 [place], [place]. This information is not intended to replace advice given to you by your health care provider. Make sure you discuss any questions you have with your health care provider.
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
## 2015-03-04 LAB — OB RESULTS CONSOLE RUBELLA ANTIBODY, IGM: Rubella: IMMUNE

## 2015-03-04 LAB — OB RESULTS CONSOLE HEPATITIS B SURFACE ANTIGEN: Hepatitis B Surface Ag: NEGATIVE

## 2015-03-07 LAB — OB RESULTS CONSOLE ABO/RH: RH Type: POSITIVE
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
## 2015-05-19 ENCOUNTER — Encounter (HOSPITAL_COMMUNITY): Payer: Self-pay | Admitting: *Deleted

## 2015-05-19 ENCOUNTER — Inpatient Hospital Stay (HOSPITAL_COMMUNITY)
Admission: AD | Admit: 2015-05-19 | Discharge: 2015-05-19 | Disposition: A | Discharge status: Home / Self Care | Payer: BLUE CROSS/BLUE SHIELD | Source: Ambulatory Visit | Attending: Obstetrics and Gynecology | Admitting: Obstetrics and Gynecology

## 2015-05-19 DIAGNOSIS — Z880 Allergy status to penicillin: Secondary | ICD-10-CM

## 2015-05-19 DIAGNOSIS — O26892 Other specified pregnancy related conditions, second trimester: Secondary | ICD-10-CM | POA: Insufficient documentation

## 2015-05-19 DIAGNOSIS — Z87891 Personal history of nicotine dependence: Secondary | ICD-10-CM | POA: Insufficient documentation

## 2015-05-19 DIAGNOSIS — Z91018 Allergy to other foods: Secondary | ICD-10-CM

## 2015-05-19 DIAGNOSIS — R109 Unspecified abdominal pain: Secondary | ICD-10-CM | POA: Diagnosis present

## 2015-05-19 DIAGNOSIS — Z3A23 23 weeks gestation of pregnancy: Secondary | ICD-10-CM | POA: Insufficient documentation

## 2015-05-19 DIAGNOSIS — Z889 Allergy status to unspecified drugs, medicaments and biological substances status: Secondary | ICD-10-CM

## 2015-05-19 DIAGNOSIS — F419 Anxiety disorder, unspecified: Secondary | ICD-10-CM

## 2015-05-19 DIAGNOSIS — O26899 Other specified pregnancy related conditions, unspecified trimester: Secondary | ICD-10-CM

## 2015-05-19 LAB — WET PREP, GENITAL
CLUE CELLS WET PREP: NONE SEEN
Trich, Wet Prep: NONE SEEN
Yeast Wet Prep HPF POC: NONE SEEN

## 2015-05-19 LAB — URINALYSIS, ROUTINE W REFLEX MICROSCOPIC
Bilirubin Urine: NEGATIVE
GLUCOSE, UA: NEGATIVE mg/dL
Hgb urine dipstick: NEGATIVE
KETONES UR: NEGATIVE mg/dL
Leukocytes, UA: NEGATIVE
Nitrite: NEGATIVE
Protein, ur: NEGATIVE mg/dL
Specific Gravity, Urine: 1.015 (ref 1.005–1.030)
Urobilinogen, UA: 0.2 mg/dL (ref 0.0–1.0)
pH: 6.5 (ref 5.0–8.0)

## 2015-05-19 NOTE — MAU Note (Signed)
Message left

## 2015-05-19 NOTE — MAU Note (Signed)
Pt has had cramping which became stronger today while standing and also causing weakness in legs. Pt stands for periods of time with part-time work. Pt denies LOF and bleeding but states that she had a lot of mucous discharged last week which MD is aware of. MD would like patient to begin every 2 week visits at office.
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
## 2015-05-19 NOTE — MAU Note (Signed)
Thornell Mule CNM to come see patient.

## 2015-05-20 LAB — GC/CHLAMYDIA PROBE AMP (~~LOC~~) NOT AT ARMC
Chlamydia: NEGATIVE
Neisseria Gonorrhea: NEGATIVE

## 2015-06-11 LAB — OB RESULTS CONSOLE RPR: RPR: NONREACTIVE

## 2015-06-11 LAB — OB RESULTS CONSOLE HIV ANTIBODY (ROUTINE TESTING): HIV: NONREACTIVE

## 2015-08-13 LAB — OB RESULTS CONSOLE GBS: STREP GROUP B AG: POSITIVE

## 2015-08-13 LAB — OB RESULTS CONSOLE GC/CHLAMYDIA
CHLAMYDIA, DNA PROBE: NEGATIVE
GC PROBE AMP, GENITAL: NEGATIVE

## 2015-08-18 NOTE — L&D Delivery Note (Signed)
Delivery Note At 9:34 AM a viable female, "Dawn Proctor", was delivered via Vaginal, Spontaneous Delivery (Presentation: LOA).  APGAR: 8, 9; weight  TBD.   Placenta status: Intact, Spontaneous.  Cord: 3 vessels with the following complications: None.  Cord pH: NA  Anesthesia: Epidural Local for repair Episiotomy: None Lacerations: 1st degree perineal; Left periurethral laceration Suture Repair: 3.0 vicryl Est. Blood Loss (mL): 334  ? Oozing from cervical surface vs cervical laceration noted--speculum placed, cervix examined closely. No laceration noted, superficial tissue just abraided.  Bleeding stopped with pressure from sponge during repair of lacerations.  Mom to postpartum.  Baby to Couplet care / Skin to Skin. Family plans outpatient circumcision. Patient is bottlefeeding. Patient does desire birth control--previous Depo user, declines that.  Will consider Nexplanon, IUD, or Nuvaring.  [name], [name] 08/21/2015, 10:19 AM

## 2015-08-20 ENCOUNTER — Inpatient Hospital Stay (HOSPITAL_COMMUNITY): Payer: BLUE CROSS/BLUE SHIELD | Admitting: Anesthesiology

## 2015-08-20 ENCOUNTER — Inpatient Hospital Stay (HOSPITAL_COMMUNITY)
Admission: AD | Admit: 2015-08-20 | Discharge: 2015-08-22 | DRG: 775 | Disposition: A | Discharge status: Home / Self Care | Payer: BLUE CROSS/BLUE SHIELD | Source: Ambulatory Visit | Attending: Obstetrics and Gynecology | Admitting: Obstetrics and Gynecology

## 2015-08-20 ENCOUNTER — Encounter (HOSPITAL_COMMUNITY): Payer: Self-pay

## 2015-08-20 DIAGNOSIS — R51 Headache: Secondary | ICD-10-CM | POA: Diagnosis present

## 2015-08-20 DIAGNOSIS — F1290 Cannabis use, unspecified, uncomplicated: Secondary | ICD-10-CM | POA: Diagnosis present

## 2015-08-20 DIAGNOSIS — Z832 Family history of diseases of the blood and blood-forming organs and certain disorders involving the immune mechanism: Secondary | ICD-10-CM

## 2015-08-20 DIAGNOSIS — D649 Anemia, unspecified: Secondary | ICD-10-CM | POA: Diagnosis present

## 2015-08-20 DIAGNOSIS — Z91410 Personal history of adult physical and sexual abuse: Secondary | ICD-10-CM | POA: Diagnosis not present

## 2015-08-20 DIAGNOSIS — F329 Major depressive disorder, single episode, unspecified: Secondary | ICD-10-CM | POA: Diagnosis present

## 2015-08-20 DIAGNOSIS — O9902 Anemia complicating childbirth: Secondary | ICD-10-CM | POA: Diagnosis present

## 2015-08-20 DIAGNOSIS — Z87891 Personal history of nicotine dependence: Secondary | ICD-10-CM | POA: Diagnosis not present

## 2015-08-20 DIAGNOSIS — Z825 Family history of asthma and other chronic lower respiratory diseases: Secondary | ICD-10-CM

## 2015-08-20 DIAGNOSIS — Z880 Allergy status to penicillin: Secondary | ICD-10-CM

## 2015-08-20 DIAGNOSIS — O99344 Other mental disorders complicating childbirth: Secondary | ICD-10-CM | POA: Diagnosis present

## 2015-08-20 DIAGNOSIS — O99019 Anemia complicating pregnancy, unspecified trimester: Secondary | ICD-10-CM

## 2015-08-20 DIAGNOSIS — Z3A37 37 weeks gestation of pregnancy: Secondary | ICD-10-CM | POA: Diagnosis not present

## 2015-08-20 DIAGNOSIS — B951 Streptococcus, group B, as the cause of diseases classified elsewhere: Secondary | ICD-10-CM

## 2015-08-20 DIAGNOSIS — O99324 Drug use complicating childbirth: Secondary | ICD-10-CM | POA: Diagnosis present

## 2015-08-20 DIAGNOSIS — F419 Anxiety disorder, unspecified: Secondary | ICD-10-CM | POA: Diagnosis present

## 2015-08-20 DIAGNOSIS — Z62810 Personal history of physical and sexual abuse in childhood: Secondary | ICD-10-CM

## 2015-08-20 DIAGNOSIS — F32A Depression, unspecified: Secondary | ICD-10-CM

## 2015-08-20 DIAGNOSIS — L309 Dermatitis, unspecified: Secondary | ICD-10-CM | POA: Diagnosis present

## 2015-08-20 DIAGNOSIS — O99824 Streptococcus B carrier state complicating childbirth: Secondary | ICD-10-CM | POA: Diagnosis present

## 2015-08-20 HISTORY — DX: Headache: R51

## 2015-08-20 HISTORY — DX: Headache, unspecified: R51.9

## 2015-08-20 LAB — TYPE AND SCREEN
ABO/RH(D): O POS
Antibody Screen: NEGATIVE

## 2015-08-20 LAB — CBC
HEMATOCRIT: 34.2 % — AB (ref 36.0–46.0)
Hemoglobin: 10.9 g/dL — ABNORMAL LOW (ref 12.0–15.0)
MCH: 23.2 pg — ABNORMAL LOW (ref 26.0–34.0)
MCHC: 31.9 g/dL (ref 30.0–36.0)
MCV: 72.9 fL — AB (ref 78.0–100.0)
Platelets: 341 10*3/uL (ref 150–400)
RBC: 4.69 MIL/uL (ref 3.87–5.11)
RDW: 13.9 % (ref 11.5–15.5)
WBC: 8.1 10*3/uL (ref 4.0–10.5)

## 2015-08-20 LAB — ABO/RH: ABO/RH(D): O POS

## 2015-08-20 MED ORDER — NALBUPHINE HCL 10 MG/ML IJ SOLN: 5.0000 mg | INTRAMUSCULAR | Status: DC | PRN

## 2015-08-20 MED ORDER — OXYTOCIN BOLUS FROM INFUSION: 500.0000 mL | INTRAVENOUS | Status: DC

## 2015-08-20 MED ORDER — OXYCODONE-ACETAMINOPHEN 5-325 MG PO TABS: 2.0000 | ORAL_TABLET | ORAL | Status: DC | PRN

## 2015-08-20 MED ORDER — LACTATED RINGERS IV SOLN
INTRAVENOUS | Status: DC
  Administered 2015-08-20: 21:00:00 via INTRAVENOUS

## 2015-08-20 MED ORDER — FLEET ENEMA 7-19 GM/118ML RE ENEM: 1.0000 | ENEMA | RECTAL | Status: DC | PRN

## 2015-08-20 MED ORDER — ACETAMINOPHEN 325 MG PO TABS: 650.0000 mg | ORAL_TABLET | ORAL | Status: DC | PRN

## 2015-08-20 MED ORDER — PHENYLEPHRINE 40 MCG/ML (10ML) SYRINGE FOR IV PUSH (FOR BLOOD PRESSURE SUPPORT)
80.0000 ug | PREFILLED_SYRINGE | INTRAVENOUS | Status: DC | PRN
  Filled 2015-08-20: qty 2
  Filled 2015-08-20: qty 20

## 2015-08-20 MED ORDER — CLINDAMYCIN PHOSPHATE 900 MG/50ML IV SOLN
900.0000 mg | Freq: Three times a day (TID) | INTRAVENOUS | Status: DC
  Administered 2015-08-20 – 2015-08-21 (×2): 900 mg via INTRAVENOUS
  Filled 2015-08-20 (×3): qty 50

## 2015-08-20 MED ORDER — LIDOCAINE HCL (PF) 1 % IJ SOLN
30.0000 mL | INTRAMUSCULAR | Status: DC | PRN
  Administered 2015-08-21: 30 mL via SUBCUTANEOUS
  Filled 2015-08-20: qty 30

## 2015-08-20 MED ORDER — LIDOCAINE HCL (PF) 1 % IJ SOLN
INTRAMUSCULAR | Status: DC | PRN
  Administered 2015-08-20: 4 mL
  Administered 2015-08-20: 2 mL

## 2015-08-20 MED ORDER — LACTATED RINGERS IV SOLN: 500.0000 mL | INTRAVENOUS | Status: DC | PRN

## 2015-08-20 MED ORDER — CITRIC ACID-SODIUM CITRATE 334-500 MG/5ML PO SOLN: 30.0000 mL | ORAL | Status: DC | PRN

## 2015-08-20 MED ORDER — FENTANYL 2.5 MCG/ML BUPIVACAINE 1/10 % EPIDURAL INFUSION (WH - ANES)
12.0000 mL/h | INTRAMUSCULAR | Status: DC | PRN
  Administered 2015-08-20 – 2015-08-21 (×2): 12 mL/h via EPIDURAL
  Filled 2015-08-20: qty 125

## 2015-08-20 MED ORDER — ONDANSETRON HCL 4 MG/2ML IJ SOLN
4.0000 mg | Freq: Four times a day (QID) | INTRAMUSCULAR | Status: DC | PRN
  Administered 2015-08-20: 4 mg via INTRAVENOUS
  Filled 2015-08-20: qty 2

## 2015-08-20 MED ORDER — DIPHENHYDRAMINE HCL 50 MG/ML IJ SOLN: 12.5000 mg | INTRAMUSCULAR | Status: DC | PRN

## 2015-08-20 MED ORDER — EPHEDRINE 5 MG/ML INJ
10.0000 mg | INTRAVENOUS | Status: DC | PRN
Start: 2015-08-20 — End: 2015-08-21
  Filled 2015-08-20: qty 2

## 2015-08-20 MED ORDER — OXYCODONE-ACETAMINOPHEN 5-325 MG PO TABS: 1.0000 | ORAL_TABLET | ORAL | Status: DC | PRN

## 2015-08-20 MED ORDER — OXYTOCIN 10 UNIT/ML IJ SOLN
2.5000 [IU]/h | INTRAMUSCULAR | Status: DC
  Administered 2015-08-21: 10:00:00 via INTRAVENOUS

## 2015-08-20 MED ORDER — HYDROXYZINE HCL 50 MG PO TABS
50.0000 mg | ORAL_TABLET | Freq: Four times a day (QID) | ORAL | Status: DC | PRN
  Filled 2015-08-20: qty 1

## 2015-08-20 MED ORDER — FENTANYL 2.5 MCG/ML BUPIVACAINE 1/10 % EPIDURAL INFUSION (WH - ANES)
14.0000 mL/h | INTRAMUSCULAR | Status: DC | PRN
  Administered 2015-08-20: 12 mL/h via EPIDURAL
  Filled 2015-08-20: qty 125

## 2015-08-20 NOTE — Anesthesia Preprocedure Evaluation (Signed)
Anesthesia Evaluation  Patient identified by MRN, date of birth, ID band Patient awake    Reviewed: Allergy & Precautions, NPO status , Patient's Chart, lab work & pertinent test results  Airway Mallampati: II  TM Distance: >3 FB Neck ROM: Full    Dental no notable dental hx.    Pulmonary former smoker,    Pulmonary exam normal breath sounds clear to auscultation       Cardiovascular negative cardio ROS Normal cardiovascular exam Rhythm:Regular Rate:Normal     Neuro/Psych  Headaches, PSYCHIATRIC DISORDERS Anxiety Depression    GI/Hepatic negative GI ROS, Neg liver ROS,   Endo/Other  negative endocrine ROS  Renal/GU negative Renal ROS     Musculoskeletal negative musculoskeletal ROS (+)   Abdominal   Peds  Hematology negative hematology ROS (+)   Anesthesia Other Findings   Reproductive/Obstetrics (+) Pregnancy                             Anesthesia Physical Anesthesia Plan  ASA: II  Anesthesia Plan: Epidural   Post-op Pain Management:    Induction:   Airway Management Planned:   Additional Equipment:   Intra-op Plan:   Post-operative Plan:   Informed Consent: I have reviewed the patients History and Physical, chart, labs and discussed the procedure including the risks, benefits and alternatives for the proposed anesthesia with the patient or authorized representative who has indicated his/her understanding and acceptance.     Plan Discussed with:   Anesthesia Plan Comments:         Anesthesia Quick Evaluation

## 2015-08-20 NOTE — MAU Note (Signed)
Dr told hr to come, because she was 3 cm, lost her plug.  Was to be put on the monitor see if she is in labor.

## 2015-08-20 NOTE — Anesthesia Procedure Notes (Signed)
Epidural Patient location during procedure: OB  Staffing Anesthesiologist: Keshauna Degraffenreid Performed by: anesthesiologist   Preanesthetic Checklist Completed: patient identified, pre-op evaluation, timeout performed, IV checked, risks and benefits discussed and monitors and equipment checked  Epidural Patient position: sitting Prep: site prepped and draped and DuraPrep Patient monitoring: heart rate Approach: midline Location: L3-L4 Injection technique: LOR air and LOR saline  Needle:  Needle type: Tuohy  Needle gauge: 17 G Needle length: 9 cm Needle insertion depth: 4 cm Catheter type: closed end flexible Catheter size: 19 Gauge Catheter at skin depth: 10 cm Test dose: negative  Assessment Sensory level: T8 Events: blood not aspirated, injection not painful, no injection resistance, negative IV test and no paresthesia  Additional Notes Reason for block:procedure for pain   
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
## 2015-08-21 ENCOUNTER — Encounter (HOSPITAL_COMMUNITY): Payer: Self-pay | Admitting: *Deleted

## 2015-08-21 DIAGNOSIS — L309 Dermatitis, unspecified: Secondary | ICD-10-CM

## 2015-08-21 DIAGNOSIS — O99019 Anemia complicating pregnancy, unspecified trimester: Secondary | ICD-10-CM

## 2015-08-21 LAB — RPR: RPR Ser Ql: NONREACTIVE

## 2015-08-21 MED ORDER — TETANUS-DIPHTH-ACELL PERTUSSIS 5-2.5-18.5 LF-MCG/0.5 IM SUSP: 0.5000 mL | Freq: Once | INTRAMUSCULAR | Status: DC

## 2015-08-21 MED ORDER — SENNOSIDES-DOCUSATE SODIUM 8.6-50 MG PO TABS
2.0000 | ORAL_TABLET | ORAL | Status: DC
  Administered 2015-08-21: 2 via ORAL
  Filled 2015-08-21: qty 2

## 2015-08-21 MED ORDER — TERBUTALINE SULFATE 1 MG/ML IJ SOLN
0.2500 mg | Freq: Once | INTRAMUSCULAR | Status: DC | PRN
  Filled 2015-08-21: qty 1

## 2015-08-21 MED ORDER — IBUPROFEN 600 MG PO TABS
600.0000 mg | ORAL_TABLET | Freq: Four times a day (QID) | ORAL | Status: DC
  Administered 2015-08-21 – 2015-08-22 (×5): 600 mg via ORAL
  Filled 2015-08-21 (×5): qty 1

## 2015-08-21 MED ORDER — ONDANSETRON HCL 4 MG PO TABS: 4.0000 mg | ORAL_TABLET | ORAL | Status: DC | PRN

## 2015-08-21 MED ORDER — DIBUCAINE 1 % RE OINT: 1.0000 "application " | TOPICAL_OINTMENT | RECTAL | Status: DC | PRN

## 2015-08-21 MED ORDER — OXYCODONE-ACETAMINOPHEN 5-325 MG PO TABS: 2.0000 | ORAL_TABLET | ORAL | Status: DC | PRN

## 2015-08-21 MED ORDER — ONDANSETRON HCL 4 MG/2ML IJ SOLN: 4.0000 mg | INTRAMUSCULAR | Status: DC | PRN

## 2015-08-21 MED ORDER — OXYCODONE-ACETAMINOPHEN 5-325 MG PO TABS: 1.0000 | ORAL_TABLET | ORAL | Status: DC | PRN

## 2015-08-21 MED ORDER — PRENATAL MULTIVITAMIN CH
1.0000 | ORAL_TABLET | Freq: Every day | ORAL | Status: DC
  Administered 2015-08-22: 1 via ORAL
  Filled 2015-08-21: qty 1

## 2015-08-21 MED ORDER — FERROUS SULFATE 325 (65 FE) MG PO TABS
325.0000 mg | ORAL_TABLET | Freq: Two times a day (BID) | ORAL | Status: DC
  Administered 2015-08-21 – 2015-08-22 (×3): 325 mg via ORAL
  Filled 2015-08-21 (×3): qty 1

## 2015-08-21 MED ORDER — ACETAMINOPHEN 325 MG PO TABS: 650.0000 mg | ORAL_TABLET | ORAL | Status: DC | PRN

## 2015-08-21 MED ORDER — BENZOCAINE-MENTHOL 20-0.5 % EX AERO
1.0000 "application " | INHALATION_SPRAY | CUTANEOUS | Status: DC | PRN
  Filled 2015-08-21: qty 56

## 2015-08-21 MED ORDER — LANOLIN HYDROUS EX OINT: TOPICAL_OINTMENT | CUTANEOUS | Status: DC | PRN

## 2015-08-21 MED ORDER — ZOLPIDEM TARTRATE 5 MG PO TABS: 5.0000 mg | ORAL_TABLET | Freq: Every evening | ORAL | Status: DC | PRN

## 2015-08-21 MED ORDER — LACTATED RINGERS IV SOLN
INTRAVENOUS | Status: DC
  Administered 2015-08-21: 150 mL via INTRAUTERINE

## 2015-08-21 MED ORDER — DIPHENHYDRAMINE HCL 25 MG PO CAPS: 25.0000 mg | ORAL_CAPSULE | Freq: Four times a day (QID) | ORAL | Status: DC | PRN

## 2015-08-21 MED ORDER — OXYTOCIN 10 UNIT/ML IJ SOLN
1.0000 m[IU]/min | INTRAVENOUS | Status: DC
  Administered 2015-08-21: 1 m[IU]/h via INTRAVENOUS
  Filled 2015-08-21: qty 4

## 2015-08-21 MED ORDER — WITCH HAZEL-GLYCERIN EX PADS: 1.0000 "application " | MEDICATED_PAD | CUTANEOUS | Status: DC | PRN

## 2015-08-21 MED ORDER — SIMETHICONE 80 MG PO CHEW: 80.0000 mg | CHEWABLE_TABLET | ORAL | Status: DC | PRN

## 2015-08-21 NOTE — Anesthesia Postprocedure Evaluation (Signed)
Anesthesia Post Note  Patient: Dawn Proctor  Procedure(s) Performed: * No procedures listed *  Patient location during evaluation: Mother Baby Anesthesia Type: Epidural Level of consciousness: awake Pain management: pain level controlled Vital Signs Assessment: post-procedure vital signs reviewed and stable Respiratory status: spontaneous breathing Cardiovascular status: stable Postop Assessment: no headache, no backache, epidural receding, patient able to bend at knees, no signs of nausea or vomiting and adequate PO intake Anesthetic complications: no    Last Vitals:  Filed Vitals:   08/21/15 1045 08/21/15 1200  BP: 114/63 106/64  Pulse: 85 78  Temp:  36.8 C  Resp:      Last Pain:  Filed Vitals:   08/21/15 1206  PainSc: 10-Worst pain ever                 Fatuma Dowers
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
## 2015-08-21 NOTE — Progress Notes (Signed)
Family at bedside. Pt and FOB in bed together.  Subjective: Comfortable w/ epidural. States "just can't feel my legs." +FM.  Objective: BP 105/46 mmHg  Pulse 76  Temp(Src) 97.5 F (36.4 C) (Oral)  Resp 20  Ht 5\' 6"  (1.676 m)  Wt 68.04 kg (150 lb)  BMI 24.22 kg/m2  LMP 12/03/2014 Today's Vitals   08/21/15 0330 08/21/15 0431 08/21/15 0445 08/21/15 0500  BP: 117/82 101/60  105/46  Pulse: 81 65  76  Temp:   97.5 F (36.4 C)   TempSrc:   Oral   Resp:      Height:      Weight:      PainSc:        FHT: BL 130 w/ moderate variability, +accels, earlys, intermittent variables, some to nadir 75 bpm, w/ slow-gradual return to baseline UC:   irregular, every 2-3 minutes SVE:   Dilation: 5.5 Effacement (%): 70 Station: -1 Exam by:: Mayford KnifeWilliams, CNM @ [id number] Pitocin at 5 mU/min - last increase at 0352 IUPC placed w/o difficulty at 0546 Clindamycin #2 given at 0457  Assessment:  Approaching active phase GBS positive  Plan: Begin Amnioinfusion Continue Pitocin Continue GBS positive prophylaxis  Sherre ScarletWILLIAMS, Arwyn Besaw CNM 08/21/2015, 5:55 AM

## 2015-08-21 NOTE — Progress Notes (Signed)
Family at bedside.  Subjective: Comfortable w/ epidural. +FM. Denies leaking or bleeding.  Objective: BP 95/55 mmHg  Pulse 61  Temp(Src) 97.4 F (36.3 C) (Oral)  Resp 20  Ht 5\' 6"  (1.676 m)  Wt 68.04 kg (150 lb)  BMI 24.22 kg/m2  LMP 12/03/2014 Filed Vitals:   08/21/15 0131 08/21/15 0151  BP: 95/55   Pulse: 61   Temp:  97.4 F (36.3 C)  Resp:      FHT: BL 150 w/ moderate variability, +accels, no decels UC:   none SVE:   Dilation: 4.5 Effacement (%): 60 Station: -2 Exam by:: McSween, RN AROM'd - moderate amount of clear fluid noted at 0148 Received IV Clindamycin at 2049 for GBS positive status (sensitive to PCN)  Assessment:  Latent phase - no cervical change since admission Cat 1 FHRT GBS positive; PCN allergy. Next Clindamycin dose due at 0500  Plan: Begin Pitocin. Reviewed R&B of augmentation, including risk of C/S, need for further intervention. Patient seemed to understand these risks and is agreeable with proceeding.  Expect progress and SVD.    Sherre Proctor, Dawn Cranfield CNM 08/21/2015, 1:58 AM
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
## 2015-08-22 LAB — CBC
HCT: 27.9 % — ABNORMAL LOW (ref 36.0–46.0)
Hemoglobin: 8.8 g/dL — ABNORMAL LOW (ref 12.0–15.0)
MCH: 23.1 pg — ABNORMAL LOW (ref 26.0–34.0)
MCHC: 31.5 g/dL (ref 30.0–36.0)
MCV: 73.2 fL — ABNORMAL LOW (ref 78.0–100.0)
Platelets: 268 K/uL (ref 150–400)
RBC: 3.81 MIL/uL — ABNORMAL LOW (ref 3.87–5.11)
RDW: 13.9 % (ref 11.5–15.5)
WBC: 6.6 K/uL (ref 4.0–10.5)

## 2015-08-22 MED ORDER — SENNOSIDES-DOCUSATE SODIUM 8.6-50 MG PO TABS: 2.0000 | ORAL_TABLET | Freq: Every day | ORAL | Status: DC

## 2015-08-22 MED ORDER — IBUPROFEN 600 MG PO TABS: 600.0000 mg | ORAL_TABLET | Freq: Four times a day (QID) | ORAL | Status: DC

## 2015-08-22 MED ORDER — MEDROXYPROGESTERONE ACETATE 150 MG/ML IM SUSP
150.0000 mg | Freq: Once | INTRAMUSCULAR | Status: AC
  Administered 2015-08-22: 150 mg via INTRAMUSCULAR
  Filled 2015-08-22: qty 1

## 2015-08-22 MED ORDER — PSEUDOEPHEDRINE HCL ER 120 MG PO TB12
120.0000 mg | ORAL_TABLET | Freq: Two times a day (BID) | ORAL | Status: DC
  Filled 2015-08-22 (×2): qty 1

## 2015-08-22 MED ORDER — FERROUS SULFATE 325 (65 FE) MG PO TABS: 325.0000 mg | ORAL_TABLET | Freq: Two times a day (BID) | ORAL | Status: DC
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
## 2015-08-22 NOTE — Discharge Instructions (Signed)
Postpartum Care After Vaginal Delivery °After you deliver your newborn (postpartum period), the usual stay in the hospital is 24-72 hours. If there were problems with your labor or delivery, or if you have other medical problems, you might be in the hospital longer.  °While you are in the hospital, you will receive help and instructions on how to care for yourself and your newborn during the postpartum period.  °While you are in the hospital: °· Be sure to tell your nurses if you have pain or discomfort, as well as where you feel the pain and what makes the pain worse. °· If you had an incision made near your vagina (episiotomy) or if you had some tearing during delivery, the nurses may put ice packs on your episiotomy or tear. The ice packs may help to reduce the pain and swelling. °· If you are breastfeeding, you may feel uncomfortable contractions of your uterus for a couple of weeks. This is normal. The contractions help your uterus get back to normal size. °· It is normal to have some bleeding after delivery. °¨ For the first 1-3 days after delivery, the flow is red and the amount may be similar to a period. °¨ It is common for the flow to start and stop. °¨ In the first few days, you may pass some small clots. Let your nurses know if you begin to pass large clots or your flow increases. °¨ Do not  flush blood clots down the toilet before having the nurse look at them. °¨ During the next 3-10 days after delivery, your flow should become more watery and pink or brown-tinged in color. °¨ Ten to fourteen days after delivery, your flow should be a small amount of yellowish-white discharge. °¨ The amount of your flow will decrease over the first few weeks after delivery. Your flow may stop in 6-8 weeks. Most women have had their flow stop by 12 weeks after delivery. °· You should change your sanitary pads frequently. °· Wash your hands thoroughly with soap and water for at least 20 seconds after changing pads, using  the toilet, or before holding or feeding your newborn. °· You should feel like you need to empty your bladder within the first 6-8 hours after delivery. °· In case you become weak, lightheaded, or faint, call your nurse before you get out of bed for the first time and before you take a shower for the first time. °· Within the first few days after delivery, your breasts may begin to feel tender and full. This is called engorgement. Breast tenderness usually goes away within 48-72 hours after engorgement occurs. You may also notice milk leaking from your breasts. If you are not breastfeeding, do not stimulate your breasts. Breast stimulation can make your breasts produce more milk. °· Spending as much time as possible with your newborn is very important. During this time, you and your newborn can feel close and get to know each other. Having your newborn stay in your room (rooming in) will help to strengthen the bond with your newborn.  It will give you time to get to know your newborn and become comfortable caring for your newborn. °· Your hormones change after delivery. Sometimes the hormone changes can temporarily cause you to feel sad or tearful. These feelings should not last more than a few days. If these feelings last longer than that, you should talk to your caregiver. °· If desired, talk to your caregiver about methods of family planning or contraception. °·   Talk to your caregiver about immunizations. Your caregiver may want you to have the following immunizations before leaving the hospital:  Tetanus, diphtheria, and pertussis (Tdap) or tetanus and diphtheria (Td) immunization. It is very important that you and your family (including grandparents) or others caring for your newborn are up-to-date with the Tdap or Td immunizations. The Tdap or Td immunization can help protect your newborn from getting ill.  Rubella immunization.  Varicella (chickenpox) immunization.  Influenza immunization. You should  receive this annual immunization if you did not receive the immunization during your pregnancy.   This information is not intended to replace advice given to you by your health care provider. Make sure you discuss any questions you have with your health care provider.   Document Released: 05/31/2007 Document Revised: 04/27/2012 Document Reviewed: 03/30/2012 Elsevier Interactive Patient Education 2016 [employer]  Medroxyprogesterone injection [Contraceptive] What is this medicine? MEDROXYPROGESTERONE (me DROX ee proe JES te rone) contraceptive injections prevent pregnancy. They provide effective birth control for 3 months. Depo-subQ Provera 104 is also used for treating pain related to endometriosis. This medicine may be used for other purposes; ask your health care provider or pharmacist if you have questions. What should I tell my health care provider before I take this medicine? They need to know if you have any of these conditions: -frequently drink alcohol -asthma -blood vessel disease or a history of a blood clot in the lungs or legs -bone disease such as osteoporosis -breast cancer -diabetes -eating disorder (anorexia nervosa or bulimia) -high blood pressure -HIV infection or AIDS -kidney disease -liver disease -mental depression -migraine -seizures (convulsions) -stroke -tobacco smoker -vaginal bleeding -an unusual or allergic reaction to medroxyprogesterone, other hormones, medicines, foods, dyes, or preservatives -pregnant or trying to get pregnant -breast-feeding How should I use this medicine? Depo-Provera Contraceptive injection is given into a muscle. Depo-subQ Provera 104 injection is given under the skin. These injections are given by a health care professional. You must not be pregnant before getting an injection. The injection is usually given during the first 5 days after the start of a menstrual period or 6 weeks after delivery of a baby. Talk to your  pediatrician regarding the use of this medicine in children. Special care may be needed. These injections have been used in female children who have started having menstrual periods. Overdosage: If you think you have taken too much of this medicine contact a poison control center or emergency room at once. NOTE: This medicine is only for you. Do not share this medicine with others. What if I miss a dose? Try not to miss a dose. You must get an injection once every 3 months to maintain birth control. If you cannot keep an appointment, call and reschedule it. If you wait longer than 13 weeks between Depo-Provera contraceptive injections or longer than 14 weeks between Depo-subQ Provera 104 injections, you could get pregnant. Use another method for birth control if you miss your appointment. You may also need a pregnancy test before receiving another injection. What may interact with this medicine? Do not take this medicine with any of the following medications: -bosentan This medicine may also interact with the following medications: -aminoglutethimide -antibiotics or medicines for infections, especially rifampin, rifabutin, rifapentine, and griseofulvin -aprepitant -barbiturate medicines such as phenobarbital or primidone -bexarotene -carbamazepine -medicines for seizures like ethotoin, felbamate, oxcarbazepine, phenytoin, topiramate -modafinil -St. John's wort This list may not describe all possible interactions. Give your health care provider a list of all the  medicines, herbs, non-prescription drugs, or dietary supplements you use. Also tell them if you smoke, drink alcohol, or use illegal drugs. Some items may interact with your medicine. °What should I watch for while using this medicine? °This drug does not protect you against HIV infection (AIDS) or other sexually transmitted diseases. °Use of this product may cause you to lose calcium from your bones. Loss of calcium may cause weak bones  (osteoporosis). Only use this product for more than 2 years if other forms of birth control are not right for you. The longer you use this product for birth control the more likely you will be at risk for weak bones. Ask your health care professional how you can keep strong bones. °You may have a change in bleeding pattern or irregular periods. Many females stop having periods while taking this drug. °If you have received your injections on time, your chance of being pregnant is very low. If you think you may be pregnant, see your health care professional as soon as possible. °Tell your health care professional if you want to get pregnant within the next year. The effect of this medicine may last a long time after you get your last injection. °What side effects may I notice from receiving this medicine? °Side effects that you should report to your doctor or health care professional as soon as possible: °-allergic reactions like skin rash, itching or hives, swelling of the face, lips, or tongue °-breast tenderness or discharge °-breathing problems °-changes in vision °-depression °-feeling faint or lightheaded, falls °-fever °-pain in the abdomen, chest, groin, or leg °-problems with balance, talking, walking °-unusually weak or tired °-yellowing of the eyes or skin °Side effects that usually do not require medical attention (report to your doctor or health care professional if they continue or are bothersome): °-acne °-fluid retention and swelling °-headache °-irregular periods, spotting, or absent periods °-temporary pain, itching, or skin reaction at site where injected °-weight gain °This list may not describe all possible side effects. Call your doctor for medical advice about side effects. You may report side effects to FDA at 1-800-FDA-1088. °Where should I keep my medicine? °This does not apply. The injection will be given to you by a health care professional. °NOTE: This sheet is a summary. It may not cover  all possible information. If you have questions about this medicine, talk to your doctor, pharmacist, or health care provider. °  °© 2016, Elsevier/Gold Standard. (2008-08-24 18:37:56) ° ° °

## 2015-08-22 NOTE — Progress Notes (Signed)
Subjective: Postpartum Day 1: Vaginal delivery, First degree perineal, left periurethral laceration Patient up ad lib, reports no syncope or dizziness. Feeding:  Bottle Contraceptive plan:  Now considering Depo, originally opposed to trying depo again - states she had hair loss. However, she is afraid the Nuvaring & IUD would fall out and does not like the idea of more "permanent" contraception and may want Depo at discharge.  R/B of each contraceptive method reviewed.   Objective: Vital signs in last 24 hours: Temp:  [97.5 F (36.4 C)-98.3 F (36.8 C)] 97.5 F (36.4 C) (01/05 0642) Pulse Rate:  [58-78] 58 (01/05 0642) Resp:  [18] 18 (01/05 0642) BP: (93-106)/(54-78) 93/54 mmHg (01/05 0642) SpO2:  [98 %-99 %] 98 % (01/04 1615)  Physical Exam:  General: alert and cooperative Lochia: appropriate Uterine Fundus: firm Perineum: healing well DVT Evaluation: No evidence of DVT seen on physical exam.   CBC Latest Ref Rng 08/22/2015 08/20/2015 01/30/2015  WBC 4.0 - 10.5 K/uL 6.6 8.1 5.2  Hemoglobin 12.0 - 15.0 g/dL 1.6(X8.8(L) 10.9(L) 9.8(L)  Hematocrit 36.0 - 46.0 % 27.9(L) 34.2(L) 29.7(L)  Platelets 150 - 400 K/uL 268 341 319   CSW Plan/Description:  1. Patient/Family Education-- perinatal mood and anxiety disorders 2. Information/[facility]erral to [facility] -- reviewed outpatient mental health resources, including Feelings After Birth support group 3. No Further Intervention Required/No Barriers to Discharge    Assessment/Plan: Status post vaginal delivery day 1. Stable SW consult today- No barrier to discharge Continue current care. Contraception  Undecided- most likley will choose Depo-Provera at discharge from hospital    Fleet ContrasRachel StallCNM 08/22/2015, 11:10 AM
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
## 2016-01-10 ENCOUNTER — Other Ambulatory Visit: Payer: Self-pay | Admitting: Medical

## 2016-03-19 ENCOUNTER — Encounter (HOSPITAL_COMMUNITY): Payer: Self-pay | Admitting: *Deleted

## 2016-03-19 ENCOUNTER — Inpatient Hospital Stay (HOSPITAL_COMMUNITY)
Admission: AD | Admit: 2016-03-19 | Discharge: 2016-03-19 | Disposition: A | Discharge status: Home / Self Care | Payer: BLUE CROSS/BLUE SHIELD | Source: Ambulatory Visit | Attending: Obstetrics & Gynecology | Admitting: Obstetrics & Gynecology

## 2016-03-19 DIAGNOSIS — F419 Anxiety disorder, unspecified: Secondary | ICD-10-CM | POA: Diagnosis not present

## 2016-03-19 DIAGNOSIS — Z880 Allergy status to penicillin: Secondary | ICD-10-CM | POA: Diagnosis not present

## 2016-03-19 DIAGNOSIS — N760 Acute vaginitis: Secondary | ICD-10-CM | POA: Insufficient documentation

## 2016-03-19 DIAGNOSIS — N390 Urinary tract infection, site not specified: Secondary | ICD-10-CM

## 2016-03-19 DIAGNOSIS — B9689 Other specified bacterial agents as the cause of diseases classified elsewhere: Secondary | ICD-10-CM

## 2016-03-19 DIAGNOSIS — A499 Bacterial infection, unspecified: Secondary | ICD-10-CM

## 2016-03-19 DIAGNOSIS — Z87891 Personal history of nicotine dependence: Secondary | ICD-10-CM | POA: Insufficient documentation

## 2016-03-19 HISTORY — DX: Reserved for concepts with insufficient information to code with codable children: IMO0002

## 2016-03-19 LAB — WET PREP, GENITAL
SPERM: NONE SEEN
Trich, Wet Prep: NONE SEEN
YEAST WET PREP: NONE SEEN

## 2016-03-19 LAB — URINE MICROSCOPIC-ADD ON

## 2016-03-19 LAB — URINALYSIS, ROUTINE W REFLEX MICROSCOPIC
BILIRUBIN URINE: NEGATIVE
GLUCOSE, UA: NEGATIVE mg/dL
Ketones, ur: NEGATIVE mg/dL
Leukocytes, UA: NEGATIVE
Nitrite: NEGATIVE
PH: 5.5 (ref 5.0–8.0)
Protein, ur: NEGATIVE mg/dL

## 2016-03-19 LAB — POCT PREGNANCY, URINE: PREG TEST UR: NEGATIVE

## 2016-03-19 MED ORDER — METRONIDAZOLE 500 MG PO TABS: 500.0000 mg | ORAL_TABLET | Freq: Two times a day (BID) | ORAL | 0 refills | Status: DC

## 2016-03-19 MED ORDER — SULFAMETHOXAZOLE-TRIMETHOPRIM 800-160 MG PO TABS: 1.0000 | ORAL_TABLET | Freq: Two times a day (BID) | ORAL | 0 refills | Status: AC

## 2016-03-19 NOTE — Discharge Instructions (Signed)
Bacterial Vaginosis Bacterial vaginosis is a vaginal infection that occurs when the normal balance of bacteria in the vagina is disrupted. It results from an overgrowth of certain bacteria. This is the most common vaginal infection in women of childbearing age. Treatment is important to prevent complications, especially in pregnant women, as it can cause a premature delivery. CAUSES  Bacterial vaginosis is caused by an increase in harmful bacteria that are normally present in smaller amounts in the vagina. Several different kinds of bacteria can cause bacterial vaginosis. However, the reason that the condition develops is not fully understood. RISK FACTORS Certain activities or behaviors can put you at an increased risk of developing bacterial vaginosis, including:  Having a new sex partner or multiple sex partners.  Douching.  Using an intrauterine device (IUD) for contraception. Women do not get bacterial vaginosis from toilet seats, bedding, swimming pools, or contact with objects around them. SIGNS AND SYMPTOMS  Some women with bacterial vaginosis have no signs or symptoms. Common symptoms include:  Grey vaginal discharge.  A fishlike odor with discharge, especially after sexual intercourse.  Itching or burning of the vagina and vulva.  Burning or pain with urination. DIAGNOSIS  Your health care provider will take a medical history and examine the vagina for signs of bacterial vaginosis. A sample of vaginal fluid may be taken. Your health care provider will look at this sample under a microscope to check for bacteria and abnormal cells. A vaginal pH test may also be done.  TREATMENT  Bacterial vaginosis may be treated with antibiotic medicines. These may be given in the form of a pill or a vaginal cream. A second round of antibiotics may be prescribed if the condition comes back after treatment. Because bacterial vaginosis increases your risk for sexually transmitted diseases, getting  treated can help reduce your risk for chlamydia, gonorrhea, HIV, and herpes. HOME CARE INSTRUCTIONS   Only take over-the-counter or prescription medicines as directed by your health care provider.  If antibiotic medicine was prescribed, take it as directed. Make sure you finish it even if you start to feel better.  Tell all sexual partners that you have a vaginal infection. They should see their health care provider and be treated if they have problems, such as a mild rash or itching.  During treatment, it is important that you follow these instructions:  Avoid sexual activity or use condoms correctly.  Do not douche.  Avoid alcohol as directed by your health care provider.  Avoid breastfeeding as directed by your health care provider. SEEK MEDICAL CARE IF:   Your symptoms are not improving after 3 days of treatment.  You have increased discharge or pain.  You have a fever. MAKE SURE YOU:   Understand these instructions.  Will watch your condition.  Will get help right away if you are not doing well or get worse. FOR MORE INFORMATION  Centers for Disease Control and Prevention, Division of STD Prevention: SolutionApps[employer]za American Sexual Health Association (ASHA): www.ashastd.org    This information is not intended to replace advice given to you by your health care provider. Make sure you discuss any questions you have with your health care provider.   Document Released: 08/03/2005 Document Revised: 08/24/2014 Document Reviewed: 03/15/2013 Elsevier Interactive Patient Education 2016 Elsevier Inc.  Bacteremia Bacteremia is the presence of bacteria in the blood. A small amount of bacteria may not cause any symptoms. Sometimes, the bacteria spread and cause infection in other parts of the body, such as the  heart, joints, bones, or brain. Having a great amount of bacteria can cause a serious, sometimes life-threatening infection called sepsis. CAUSES This condition is caused  by bacteria that get into the blood. Bacteria can enter the blood:  During a dental or medical procedure.  After you brush your teeth so hard that the gums bleed.  Through a scrape or cut on your skin. More severe types of bacteremia can be caused by:  A bacterial infection, such as pneumonia, that spreads to the blood.  Using a dirty needle. RISK FACTORS This condition is more likely to develop in:  Children and elderly adults.  People who have a long-lasting (chronic) disease or medical condition.  People who have an artificial joint or heart valve.  People who have heart valve disease.  People who have a tube, such as a catheter or IV tube, that has been inserted for a medical treatment.  People who have a weak body defense system (immune system).  People who use IV drugs. SYMPTOMS Usually, this condition does not cause symptoms when it is mild. When it is more serious, it may cause:  Fever.  Chills.  Racing heart.  Shortness of breath.  Dizziness.  Weakness.  Confusion.  Nausea or vomiting.  Diarrhea. Bacteremia that has spread to other parts of the body may cause symptoms in those areas. DIAGNOSIS This condition may be diagnosed with a physical exam and tests, such as:  A complete blood count (CBC). This test looks for signs of infection.  Blood cultures. These look for bacteria in your blood.  Tests of any IV tubes. These look for a source of infection.  Urine tests.  Imaging tests, such as an X-ray, CT scan, MRI, or heart ultrasound. TREATMENT If the condition is mild, treatment is usually not needed. Usually, the body's immune system will remove the bacteria. If the condition is more serious, it may be treated with:  Antibiotic medicines through an IV tube. These may be given for about 2 weeks. At first, the antibiotic that is given may kill most types of blood bacteria. If your test results show that a certain kind of bacteria is causing  problems, the antibiotic may be changed to kill only the bacteria that are causing problems.  Antibiotics taken by mouth.  Removing any catheter or IV tube that is a source of infection.  Blood pressure and breathing support, if needed.  Surgery to control the source or spread of infection, if needed. HOME CARE INSTRUCTIONS  Take over-the-counter and prescription medicines only as told by your health care provider.  If you were prescribed an antibiotic, take it as told by your health care provider. Do not stop taking the antibiotic even if you start to feel better.  Rest at home until your condition is under control.  Drink enough fluid to keep your urine clear or pale yellow.  Keep all follow-up visits as told by your health care provider. This is important. PREVENTION Take these actions to help prevent future episodes of bacteremia:  Get all vaccinations as recommended by your health care provider.  Clean and cover scrapes or cuts.  Bathe regularly.  Wash your hands often.  Before any dental or surgical procedure, ask your health care provider if you should take an antibiotic. SEEK MEDICAL CARE IF:  Your symptoms get worse.  You continue to have symptoms after treatment.  You develop new symptoms after treatment. SEEK IMMEDIATE MEDICAL CARE IF:  You have chest pain or trouble breathing.  You develop confusion, dizziness, or weakness.  You develop pale skin.   This information is not intended to replace advice given to you by your health care provider. Make sure you discuss any questions you have with your health care provider.   Document Released: 05/17/2006 Document Revised: 04/24/2015 Document Reviewed: 10/06/2014 Elsevier Interactive Patient Education [employer]

## 2016-03-19 NOTE — MAU Provider Note (Signed)
History     CSN: [id number]  Arrival date and time: 03/19/16 1135   First Provider Initiated Contact with Patient 03/19/16 1204      Chief Complaint  Patient presents with  . Urinary Frequency  . Back Pain  . Pelvic Pain  . Vaginal Bleeding  . Rash   HPI  [name] is a 20 y.o. female G2P1011 here with multiple complaints. She is a patient of [facility] however states they will not see her because "I owe them money".  She presents today with pelvic pain, lower back pain, and urinary frequency. She would like to have STI testing today and to see if she has a UTI.  She is having abnormal spotting. She has never had spotting in between her periods. She had a normal menstrual cycle two weeks ago and then started spotting two days ago.  She has the patch for birth control. She is happy with this method so far.    OB History    Gravida Para Term Preterm AB Living   SAB TAB Ectopic Multiple Live Births     1   0 1      Past Medical History:  Diagnosis Date  . Allergy   . Anxiety   . Headache   . Knee fracture 2015  . Seasonal allergies     Past Surgical History:  Procedure Laterality Date  . I&D EXTREMITY Left 03/04/2014   Procedure: IRRIGATION AND DEBRIDEMENT KNEE ARTHROTOMY WITH REMOVAL OF BULLET FRAGMENTS;  Surgeon: Eldred Manges, MD;  Location: [facility] OR;  Service: Orthopedics;  Laterality: Left;  . KNEE SURGERY      Family History  Problem Relation Age of Onset  . Emphysema Paternal Grandmother     Social History  Substance Use Topics  . Smoking status: Former Smoker    Packs/day: 0.50    Types: Cigarettes  . Smokeless tobacco: Never Used  . Alcohol use No    Allergies:  Allergies  Allergen Reactions  . Orange Fruit [Citrus] Swelling    Swelling around the lips  . Penicillins Hives    Has patient had a PCN reaction causing immediate rash, facial/tongue/throat swelling, SOB or lightheadedness with hypotension: No Has patient had a PCN  reaction causing severe rash involving mucus membranes or skin necrosis: No Has patient had a PCN reaction that required hospitalization No Has patient had a PCN reaction occurring within the last 10 years: Yes If all of the above answers are "NO", then may proceed with Cephalosporin use.   . Tomato Swelling    Swelling around the lips    Prescriptions Prior to Admission  Medication Sig Dispense Refill Last Dose  . ibuprofen (ADVIL,MOTRIN) 200 MG tablet Take 400 mg by mouth every 6 (six) hours as needed for headache, moderate pain or cramping.   03/18/2016 at Unknown time  . XULANE 150-35 MCG/24HR transdermal patch Place 1 patch onto the skin once a week.  1 continuous   Results for orders placed or performed during the hospital encounter of 03/19/16 (from the past 48 hour(s))  Urinalysis, Routine w reflex microscopic (not at [facility])     Status: Abnormal   Collection Time: 03/19/16 11:40 AM  Result Value Ref Range   Color, Urine YELLOW YELLOW   APPearance CLEAR CLEAR   Specific Gravity, Urine >1.030 (H) 1.005 - 1.030   pH 5.5 5.0 - 8.0   Glucose, UA NEGATIVE NEGATIVE mg/dL  Hgb urine dipstick TRACE (A) NEGATIVE   Bilirubin Urine NEGATIVE NEGATIVE   Ketones, ur NEGATIVE NEGATIVE mg/dL   Protein, ur NEGATIVE NEGATIVE mg/dL   Nitrite NEGATIVE NEGATIVE   Leukocytes, UA NEGATIVE NEGATIVE  Urine microscopic-add on     Status: Abnormal   Collection Time: 03/19/16 11:40 AM  Result Value Ref Range   Squamous Epithelial / LPF 0-5 (A) NONE SEEN   WBC, UA 0-5 0 - 5 WBC/hpf   RBC / HPF 0-5 0 - 5 RBC/hpf   Bacteria, UA MANY (A) NONE SEEN   Urine-Other MUCOUS PRESENT   Pregnancy, urine POC     Status: None   Collection Time: 03/19/16 11:51 AM  Result Value Ref Range   Preg Test, Ur NEGATIVE NEGATIVE    Comment:        THE SENSITIVITY OF THIS METHODOLOGY IS >24 mIU/mL   Wet prep, genital     Status: Abnormal   Collection Time: 03/19/16 12:12 PM  Result Value Ref Range   Yeast Wet Prep  HPF POC NONE SEEN NONE SEEN   Trich, Wet Prep NONE SEEN NONE SEEN   Clue Cells Wet Prep HPF POC PRESENT (A) NONE SEEN   WBC, Wet Prep HPF POC FEW (A) NONE SEEN    Comment: MANY BACTERIA SEEN   Sperm NONE SEEN     Review of Systems  Constitutional: Negative for chills and fever.  Gastrointestinal: Positive for abdominal pain.  Genitourinary: Positive for dysuria, frequency, hematuria and urgency. Negative for flank pain.  Psychiatric/Behavioral: Positive for memory loss.   Physical Exam   Blood pressure 105/58, pulse 68, temperature 98.1 F (36.7 C), temperature source Oral, resp. rate 18, height 5\' 6"  (1.676 m), weight 135 lb (61.2 kg), last menstrual period 03/17/2016, SpO2 100 %, not currently breastfeeding.  Physical Exam  Constitutional: She is oriented to person, place, and time. She appears well-developed and well-nourished. No distress.  HENT:  Head: Normocephalic.  Eyes: Pupils are equal, round, and reactive to light.  Respiratory: Effort normal.  GI: Soft.  Genitourinary:  Genitourinary Comments: Speculum exam: Vagina - Small amount of creamy, pink blood noted in the vagina, mild odor Cervix - no active bleeding at this time  Bimanual exam: Cervix closed, no CMT  Uterus non tender, normal size Adnexa non tender, no masses bilaterally GC/Chlam, wet prep done Chaperone present for exam.  Musculoskeletal: Normal range of motion.  Neurological: She is alert and oriented to person, place, and time.  Skin: Skin is warm. She is not diaphoretic.  Psychiatric: Her behavior is normal.    MAU Course  Procedures  None  MDM  Wet prep GC UA Discussed patient with Dr. Sallye Ober   Assessment and Plan   A:  1. Acute UTI   2. BV (bacterial vaginosis)     P:  Discharge home in stable condition Rx: Flagyl, Bactrim Return to MAU if symptoms worsen, for emergencies only Follow up with the [facility] as needed    Duane Lope, NP 03/19/2016 12:29 PM

## 2016-03-19 NOTE — MAU Note (Signed)
Pt believes she may have a UTI.  Urinating frequently and pain in back and pelvis.  Irregular bleeding.  Had a period 2 weeks ago.  Now started again yesterday.  On patch for birth control.  Switched from shots in April, did not use anything until she started the patches in June.  Unprotected sex.

## 2016-03-20 LAB — GC/CHLAMYDIA PROBE AMP (~~LOC~~) NOT AT ARMC
Chlamydia: NEGATIVE
Neisseria Gonorrhea: NEGATIVE

## 2016-07-20 ENCOUNTER — Encounter (HOSPITAL_COMMUNITY): Payer: Self-pay

## 2016-07-20 ENCOUNTER — Emergency Department (HOSPITAL_COMMUNITY)
Admission: EM | Admit: 2016-07-20 | Discharge: 2016-07-20 | Disposition: A | Discharge status: Home / Self Care | Payer: BLUE CROSS/BLUE SHIELD | Attending: Emergency Medicine | Admitting: Emergency Medicine

## 2016-07-20 ENCOUNTER — Emergency Department (HOSPITAL_COMMUNITY): Payer: BLUE CROSS/BLUE SHIELD

## 2016-07-20 DIAGNOSIS — W228XXA Striking against or struck by other objects, initial encounter: Secondary | ICD-10-CM | POA: Diagnosis not present

## 2016-07-20 DIAGNOSIS — R300 Dysuria: Secondary | ICD-10-CM | POA: Insufficient documentation

## 2016-07-20 DIAGNOSIS — Z87891 Personal history of nicotine dependence: Secondary | ICD-10-CM | POA: Insufficient documentation

## 2016-07-20 DIAGNOSIS — R350 Frequency of micturition: Secondary | ICD-10-CM | POA: Diagnosis not present

## 2016-07-20 DIAGNOSIS — Y999 Unspecified external cause status: Secondary | ICD-10-CM | POA: Diagnosis not present

## 2016-07-20 DIAGNOSIS — M79671 Pain in right foot: Secondary | ICD-10-CM | POA: Diagnosis present

## 2016-07-20 DIAGNOSIS — Y939 Activity, unspecified: Secondary | ICD-10-CM | POA: Insufficient documentation

## 2016-07-20 DIAGNOSIS — Y92481 Parking lot as the place of occurrence of the external cause: Secondary | ICD-10-CM | POA: Diagnosis not present

## 2016-07-20 LAB — URINALYSIS, ROUTINE W REFLEX MICROSCOPIC
BILIRUBIN URINE: NEGATIVE
GLUCOSE, UA: NEGATIVE mg/dL
Hgb urine dipstick: NEGATIVE
KETONES UR: NEGATIVE mg/dL
Leukocytes, UA: NEGATIVE
Nitrite: NEGATIVE
PH: 5.5 (ref 5.0–8.0)
Protein, ur: NEGATIVE mg/dL
Specific Gravity, Urine: 1.029 (ref 1.005–1.030)

## 2016-07-20 LAB — POC URINE PREG, ED: Preg Test, Ur: NEGATIVE

## 2016-07-20 NOTE — Discharge Instructions (Signed)
Wear ace wrap for now until foot is feeling better. Recommend to increase your free water intake. Return here for new concerns.

## 2016-07-20 NOTE — ED Notes (Signed)
Patient transported to X-ray 

## 2016-07-20 NOTE — ED Notes (Signed)
Pt states she rolloed her right foot 4 days ago with continued swelling and pain ayt right lateral dorsal foot. Pt ambulates with steady gait.

## 2016-07-20 NOTE — ED Triage Notes (Signed)
Pt. Tripped four days ago and injured rt. Foot. Lateral swelling noted to the foot.  +CNS  Pt. Also is having urgency and dsyuria.  She would like to be checked for UTI .  Pt. Denies any n/v/d.  Skin is warm and dry.   Pt. Denies any vaginal discharge, bleeding or pain.
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
## 2016-10-03 ENCOUNTER — Encounter (HOSPITAL_COMMUNITY): Payer: Self-pay | Admitting: *Deleted

## 2016-10-03 ENCOUNTER — Inpatient Hospital Stay (HOSPITAL_COMMUNITY)
Admission: AD | Admit: 2016-10-03 | Discharge: 2016-10-03 | Disposition: A | Discharge status: Home / Self Care | Payer: BLUE CROSS/BLUE SHIELD | Source: Ambulatory Visit | Attending: Obstetrics and Gynecology | Admitting: Obstetrics and Gynecology

## 2016-10-03 DIAGNOSIS — N760 Acute vaginitis: Secondary | ICD-10-CM | POA: Diagnosis not present

## 2016-10-03 DIAGNOSIS — B9689 Other specified bacterial agents as the cause of diseases classified elsewhere: Secondary | ICD-10-CM

## 2016-10-03 DIAGNOSIS — N926 Irregular menstruation, unspecified: Secondary | ICD-10-CM

## 2016-10-03 DIAGNOSIS — Z3202 Encounter for pregnancy test, result negative: Secondary | ICD-10-CM | POA: Diagnosis not present

## 2016-10-03 DIAGNOSIS — Z87891 Personal history of nicotine dependence: Secondary | ICD-10-CM | POA: Insufficient documentation

## 2016-10-03 LAB — URINALYSIS, ROUTINE W REFLEX MICROSCOPIC
BACTERIA UA: NONE SEEN
Bilirubin Urine: NEGATIVE
Glucose, UA: NEGATIVE mg/dL
HGB URINE DIPSTICK: NEGATIVE
Ketones, ur: NEGATIVE mg/dL
NITRITE: NEGATIVE
Protein, ur: NEGATIVE mg/dL
SPECIFIC GRAVITY, URINE: 1.019 (ref 1.005–1.030)
pH: 5 (ref 5.0–8.0)

## 2016-10-03 LAB — WET PREP, GENITAL
Sperm: NONE SEEN
Trich, Wet Prep: NONE SEEN
Yeast Wet Prep HPF POC: NONE SEEN

## 2016-10-03 LAB — POCT PREGNANCY, URINE: PREG TEST UR: NEGATIVE

## 2016-10-03 MED ORDER — METRONIDAZOLE 500 MG PO TABS: 500.0000 mg | ORAL_TABLET | Freq: Two times a day (BID) | ORAL | 0 refills | Status: DC

## 2016-10-03 NOTE — MAU Provider Note (Signed)
History     CSN: [id number]  Arrival date and time: 10/03/16 1129   First Provider Initiated Contact with Patient 10/03/16 1149      Chief Complaint  Patient presents with  . Possible Pregnancy   HPI [name] is a 21 y.o. G2P1011  who presents to MAU today with complaint of possible pregnancy. The patient denies abdominal pain, vaginal bleeding, vaginal discharge, UTI symptoms, N/V or fever. She states LMP 08/16/16 and no history of irregular periods. She has not taken HPT yet. She is having vaginal itching x 2 weeks and wants to be checked. She denies rash or swelling, unsure about possible mild redness.   OB History    Gravida Para Term Preterm AB Living   2 1 1   1 1    SAB TAB Ectopic Multiple Live Births     1   0 1      Past Medical History:  Diagnosis Date  . Allergy   . Anxiety   . Headache   . Knee fracture 2015  . Seasonal allergies     Past Surgical History:  Procedure Laterality Date  . I&D EXTREMITY Left 03/04/2014   Procedure: IRRIGATION AND DEBRIDEMENT KNEE ARTHROTOMY WITH REMOVAL OF BULLET FRAGMENTS;  Surgeon: Eldred Manges, MD;  Location: [facility] OR;  Service: Orthopedics;  Laterality: Left;  . KNEE SURGERY      Family History  Problem Relation Age of Onset  . Emphysema Paternal Grandmother     Social History  Substance Use Topics  . Smoking status: Former Smoker    Packs/day: 0.50    Types: Cigarettes  . Smokeless tobacco: Never Used  . Alcohol use No    Allergies:  Allergies  Allergen Reactions  . Orange Fruit [Citrus] Swelling    Swelling around the lips  . Penicillins Hives    Has patient had a PCN reaction causing immediate rash, facial/tongue/throat swelling, SOB or lightheadedness with hypotension: No Has patient had a PCN reaction causing severe rash involving mucus membranes or skin necrosis: No Has patient had a PCN reaction that required hospitalization No Has patient had a PCN reaction occurring within the last 10  years: Yes If all of the above answers are "NO", then may proceed with Cephalosporin use.   . Tomato Swelling    Swelling around the lips    Prescriptions Prior to Admission  Medication Sig Dispense Refill Last Dose  . ibuprofen (ADVIL,MOTRIN) 200 MG tablet Take 400 mg by mouth every 6 (six) hours as needed for headache, moderate pain or cramping.   Past Week at Unknown time    Review of Systems  Constitutional: Negative for fever.  Gastrointestinal: Negative for abdominal pain, constipation, diarrhea, nausea and vomiting.  Genitourinary: Positive for vaginal pain. Negative for dysuria, frequency, urgency, vaginal bleeding and vaginal discharge.   Physical Exam   Blood pressure 100/56, pulse 67, temperature 98.4 F (36.9 C), temperature source Oral, resp. rate 16, height 5\' 5"  (1.651 m), weight 131 lb 3.2 oz (59.5 kg), last menstrual period 08/16/2016, not currently breastfeeding.  Physical Exam  Nursing note and vitals reviewed. Constitutional: She is oriented to person, place, and time. She appears well-developed and well-nourished. No distress.  HENT:  Head: Normocephalic and atraumatic.  Cardiovascular: Normal rate.   Respiratory: Effort normal.  GI: Soft. She exhibits no distension and no mass. There is no tenderness. There is no rebound and no guarding.  Genitourinary: Uterus is not enlarged and not tender. Cervix exhibits  no motion tenderness, no discharge and no friability. Right adnexum displays no mass and no tenderness. Left adnexum displays no mass and no tenderness. There is bleeding (scant) in the vagina. Injury: small amount of thin, white discharge noted. Vaginal discharge found.  Neurological: She is alert and oriented to person, place, and time.  Skin: Skin is warm and dry. No erythema.  Psychiatric: She has a normal mood and affect.   Results for orders placed or performed during the hospital encounter of 10/03/16 (from the past 24 hour(s))  Wet prep, genital      Status: Abnormal   Collection Time: 10/03/16 12:59 PM  Result Value Ref Range   Yeast Wet Prep HPF POC NONE SEEN NONE SEEN   Trich, Wet Prep NONE SEEN NONE SEEN   Clue Cells Wet Prep HPF POC PRESENT (A) NONE SEEN   WBC, Wet Prep HPF POC MODERATE (A) NONE SEEN   Sperm NONE SEEN   Urinalysis, Routine w reflex microscopic     Status: Abnormal   Collection Time: 10/03/16 12:59 PM  Result Value Ref Range   Color, Urine YELLOW YELLOW   APPearance CLEAR CLEAR   Specific Gravity, Urine 1.019 1.005 - 1.030   pH 5.0 5.0 - 8.0   Glucose, UA NEGATIVE NEGATIVE mg/dL   Hgb urine dipstick NEGATIVE NEGATIVE   Bilirubin Urine NEGATIVE NEGATIVE   Ketones, ur NEGATIVE NEGATIVE mg/dL   Protein, ur NEGATIVE NEGATIVE mg/dL   Nitrite NEGATIVE NEGATIVE   Leukocytes, UA TRACE (A) NEGATIVE   RBC / HPF 0-5 0 - 5 RBC/hpf   WBC, UA 0-5 0 - 5 WBC/hpf   Bacteria, UA NONE SEEN NONE SEEN   Squamous Epithelial / LPF 0-5 (A) NONE SEEN   Mucous PRESENT   Pregnancy, urine POC     Status: None   Collection Time: 10/03/16  1:02 PM  Result Value Ref Range   Preg Test, Ur NEGATIVE NEGATIVE     MAU Course  Procedures None  MDM Discussed patient with Dr. Normand Sloopillard. Agrees that UPT is not medically indicated at this time.  Wet prep and GC/Chlamydia obtained. Patient declined HIV testing.  UPT ordered due to bleeding on exam.  UPT negative Assessment and Plan  A: Menses Bacterial vaginosis Negative pregnancy test  P: Discharge home Rx for Flagyl given to patient  Warning signs for worsening condition discussed Patient advised to follow-up with [facility] as planned for routine GYN care Patient may return to MAU as needed or if her condition were to change or worsen   Marny LowensteinJulie N Wenzel, PA-C  10/03/2016, 1:48 PM

## 2016-10-03 NOTE — Discharge Instructions (Signed)

## 2016-10-03 NOTE — MAU Note (Signed)
Missed her cycle, last period she knows of was end of Dec. Has not done a home test. No bleeding. Is having some vag itching, no d/c or odor  having some back pain for the past month- dull ache, comes and goes- usually in the mornings

## 2016-10-05 LAB — GC/CHLAMYDIA PROBE AMP (~~LOC~~) NOT AT ARMC
CHLAMYDIA, DNA PROBE: NEGATIVE
NEISSERIA GONORRHEA: POSITIVE — AB

## 2016-10-06 ENCOUNTER — Telehealth (HOSPITAL_COMMUNITY): Payer: Self-pay | Admitting: *Deleted

## 2016-10-06 NOTE — Telephone Encounter (Signed)
Called patient. Verified DOB with patient. Pt aware her lab results during her last visit to [facility] showed she was positive for Gonorrhea. Pt aware to contact her the local Health [facility] in the county she resides to be seen for treatment of her positive results. Patient aware once treatment is received, abstain from intercourse x 2 weeks. Also, she is aware to inform her partner because they will also need to be treated. Communicable Disease Report faxed to health department. 

## 2016-11-28 ENCOUNTER — Encounter (HOSPITAL_COMMUNITY): Payer: Self-pay | Admitting: *Deleted

## 2016-11-28 ENCOUNTER — Ambulatory Visit (HOSPITAL_COMMUNITY)
Admission: EM | Admit: 2016-11-28 | Discharge: 2016-11-28 | Disposition: A | Discharge status: Home / Self Care | Payer: BLUE CROSS/BLUE SHIELD | Attending: Family Medicine | Admitting: Family Medicine

## 2016-11-28 DIAGNOSIS — R109 Unspecified abdominal pain: Secondary | ICD-10-CM | POA: Diagnosis not present

## 2016-11-28 DIAGNOSIS — N390 Urinary tract infection, site not specified: Secondary | ICD-10-CM | POA: Diagnosis not present

## 2016-11-28 DIAGNOSIS — Z3202 Encounter for pregnancy test, result negative: Secondary | ICD-10-CM

## 2016-11-28 DIAGNOSIS — K529 Noninfective gastroenteritis and colitis, unspecified: Secondary | ICD-10-CM | POA: Diagnosis not present

## 2016-11-28 LAB — POCT URINALYSIS DIP (DEVICE)
Glucose, UA: NEGATIVE mg/dL
HGB URINE DIPSTICK: NEGATIVE
KETONES UR: NEGATIVE mg/dL
Nitrite: NEGATIVE
Protein, ur: 30 mg/dL — AB
SPECIFIC GRAVITY, URINE: 1.025 (ref 1.005–1.030)
UROBILINOGEN UA: 0.2 mg/dL (ref 0.0–1.0)
pH: 5.5 (ref 5.0–8.0)

## 2016-11-28 LAB — POCT PREGNANCY, URINE: Preg Test, Ur: NEGATIVE

## 2016-11-28 MED ORDER — ONDANSETRON 4 MG PO TBDP
4.0000 mg | ORAL_TABLET | Freq: Once | ORAL | Status: AC
  Administered 2016-11-28: 4 mg via ORAL

## 2016-11-28 MED ORDER — ONDANSETRON HCL 4 MG PO TABS: 4.0000 mg | ORAL_TABLET | Freq: Four times a day (QID) | ORAL | 0 refills | Status: DC

## 2016-11-28 MED ORDER — SULFAMETHOXAZOLE-TRIMETHOPRIM 800-160 MG PO TABS: 1.0000 | ORAL_TABLET | Freq: Two times a day (BID) | ORAL | 0 refills | Status: AC

## 2016-11-28 MED ORDER — ONDANSETRON 4 MG PO TBDP
ORAL_TABLET | ORAL | Status: AC
  Filled 2016-11-28: qty 1

## 2016-11-28 NOTE — ED Notes (Signed)
Urine specimen obtained and in lab 

## 2016-11-28 NOTE — ED Triage Notes (Signed)
C/O low back pain and bilat side abdominal pains x 2 days with intermittent severity.  States at times feels she is going to pass out due to pain, and becomes very clammy.  C/O vomiting.  Denies fevers.  Unk if pregnant.

## 2016-11-28 NOTE — ED Provider Notes (Signed)
CSN: [id number]     Arrival date & time 11/28/16  1645 History   None    Chief Complaint  Patient presents with  . Back Pain  . Abdominal Pain   (Consider location/radiation/quality/duration/timing/severity/associated sxs/prior Treatment) 21 y.o. female presents with lower abdominal pain, pain with urination, and diarrhea X 2 days and lower back pain X 2 weeks. Patient denies any fevers, vaginal discharge sick contacts.Condition is acute  in nature. Condition is made better by nothing. Condition is made worse by nothing. Patient denies any treatment prior to there arrival at this facility.        Past Medical History:  Diagnosis Date  . Allergy   . Anxiety   . Headache   . Knee fracture 2015  . Seasonal allergies    Past Surgical History:  Procedure Laterality Date  . I&D EXTREMITY Left 03/04/2014   Procedure: IRRIGATION AND DEBRIDEMENT KNEE ARTHROTOMY WITH REMOVAL OF BULLET FRAGMENTS;  Surgeon: Eldred Manges, MD;  Location: [facility] OR;  Service: Orthopedics;  Laterality: Left;  . KNEE SURGERY     Family History  Problem Relation Age of Onset  . Emphysema Paternal Grandmother    Social History  Substance Use Topics  . Smoking status: Former Smoker    Packs/day: 0.50    Types: Cigarettes  . Smokeless tobacco: Never Used  . Alcohol use No   OB History    Gravida Para Term Preterm AB Living   SAB TAB Ectopic Multiple Live Births     1   0 1     Review of Systems  Constitutional: Negative for chills and fever.  HENT: Negative for ear pain and sore throat.   Eyes: Negative for pain and visual disturbance.  Respiratory: Negative for cough and shortness of breath.   Cardiovascular: Negative for chest pain and palpitations.  Gastrointestinal: Positive for abdominal pain, diarrhea, nausea and vomiting.  Genitourinary: Positive for difficulty urinating, dysuria, pelvic pain and urgency. Negative for hematuria.  Musculoskeletal: Negative for arthralgias and back  pain.  Skin: Negative for color change and rash.  Neurological: Negative for seizures and syncope.  All other systems reviewed and are negative.   Allergies  Orange fruit [citrus]; Penicillins; and Tomato  Home Medications   Prior to Admission medications   Medication Sig Start Date End Date Taking? Authorizing Provider  ibuprofen (ADVIL,MOTRIN) 200 MG tablet Take 400 mg by mouth every 6 (six) hours as needed for headache, moderate pain or cramping.    Historical Provider, MD  metroNIDAZOLE (FLAGYL) 500 MG tablet Take 1 tablet (500 mg total) by mouth 2 (two) times daily. 10/03/16   Marny Lowenstein, PA-C  ondansetron (ZOFRAN) 4 MG tablet Take 1 tablet (4 mg total) by mouth every 6 (six) hours. 11/28/16   Alene Mires, NP  sulfamethoxazole-trimethoprim (BACTRIM DS,SEPTRA DS) 800-160 MG tablet Take 1 tablet by mouth 2 (two) times daily. 11/28/16 12/05/16  Alene Mires, NP   Meds Ordered and Administered this Visit   Medications  ondansetron (ZOFRAN-ODT) disintegrating tablet 4 mg (4 mg Oral Given 11/28/16 1741)    BP (!) 87/49   Pulse 94   Temp 98.1 F (36.7 C) (Oral)   Resp 20   LMP 10/20/2016 (Approximate)   SpO2 100%  No data found.   Physical Exam  Constitutional: She appears well-developed and well-nourished. No distress.  HENT:  Head: Normocephalic and atraumatic.  Eyes: Conjunctivae are normal.  Neck: Neck  supple.  Cardiovascular: Normal rate and regular rhythm.   No murmur heard. Pulmonary/Chest: Effort normal and breath sounds normal. No respiratory distress.  Abdominal: Soft. There is no tenderness.   Hyperactive bowel sounds  Musculoskeletal: She exhibits no edema.  Neurological: She is alert.  Skin: Skin is warm and dry.  Psychiatric: She has a normal mood and affect.  Nursing note and vitals reviewed.   Urgent Care Course     Procedures (including critical care time)  Labs Review Labs Reviewed  POCT URINALYSIS DIP (DEVICE) - Abnormal;  Notable for the following:       Result Value   Bilirubin Urine SMALL (*)    Protein, ur 30 (*)    Leukocytes, UA SMALL (*)    All other components within normal limits  POCT PREGNANCY, URINE    Imaging Review No results found.    MDM   1. Lower urinary tract infectious disease   2. Gastroenteritis       Alene Mires, NP 11/28/16 1802    Alene Mires, NP 11/28/16 [phone]

## 2017-06-08 ENCOUNTER — Inpatient Hospital Stay (HOSPITAL_COMMUNITY)
Admission: AD | Admit: 2017-06-08 | Discharge: 2017-06-08 | Disposition: A | Discharge status: Home / Self Care | Payer: BLUE CROSS/BLUE SHIELD | Source: Ambulatory Visit | Attending: Family Medicine | Admitting: Family Medicine

## 2017-06-08 ENCOUNTER — Encounter (HOSPITAL_COMMUNITY): Payer: Self-pay | Admitting: *Deleted

## 2017-06-08 DIAGNOSIS — N760 Acute vaginitis: Secondary | ICD-10-CM

## 2017-06-08 DIAGNOSIS — B9689 Other specified bacterial agents as the cause of diseases classified elsewhere: Secondary | ICD-10-CM

## 2017-06-08 DIAGNOSIS — R102 Pelvic and perineal pain: Secondary | ICD-10-CM | POA: Diagnosis present

## 2017-06-08 DIAGNOSIS — Z87891 Personal history of nicotine dependence: Secondary | ICD-10-CM | POA: Insufficient documentation

## 2017-06-08 DIAGNOSIS — Z3202 Encounter for pregnancy test, result negative: Secondary | ICD-10-CM | POA: Diagnosis not present

## 2017-06-08 DIAGNOSIS — Z880 Allergy status to penicillin: Secondary | ICD-10-CM | POA: Insufficient documentation

## 2017-06-08 LAB — URINALYSIS, ROUTINE W REFLEX MICROSCOPIC
Bilirubin Urine: NEGATIVE
GLUCOSE, UA: NEGATIVE mg/dL
Hgb urine dipstick: NEGATIVE
Ketones, ur: NEGATIVE mg/dL
Nitrite: NEGATIVE
Protein, ur: NEGATIVE mg/dL
SPECIFIC GRAVITY, URINE: 1.021 (ref 1.005–1.030)
pH: 6 (ref 5.0–8.0)

## 2017-06-08 LAB — WET PREP, GENITAL
SPERM: NONE SEEN
Trich, Wet Prep: NONE SEEN
Yeast Wet Prep HPF POC: NONE SEEN

## 2017-06-08 LAB — POCT PREGNANCY, URINE: PREG TEST UR: NEGATIVE

## 2017-06-08 MED ORDER — METRONIDAZOLE 500 MG PO TABS: 500.0000 mg | ORAL_TABLET | Freq: Two times a day (BID) | ORAL | 0 refills | Status: DC

## 2017-06-08 NOTE — MAU Provider Note (Signed)
History     CSN: [id number]  Arrival date and time: 06/08/17 4098   First Provider Initiated Contact with Patient 06/08/17 1950      Chief Complaint  Patient presents with  . Pelvic Pain  . Vaginal Discharge   HPI [name] is a 21 y.o. G57P1011 female who presents to MAU today with complaint of discharge and pelvic pain x 2-3 days. The patient states discharge is white and sometimes causes irritation. She denies odor. She has had spotting recently, but feels that is consistent with her Nexplanon. She has a new partner x 1 month and has recently stopped using condoms. She has had associated lower abdominal pain that has been mild to moderate and intermittent. She takes Ibuprofen with some relief. She denies fever.    OB History    Gravida Para Term Preterm AB Living   2 1 1   1 1    SAB TAB Ectopic Multiple Live Births     1   0 1      Past Medical History:  Diagnosis Date  . Allergy   . Anxiety   . Headache   . Knee fracture 2015  . Seasonal allergies     Past Surgical History:  Procedure Laterality Date  . I&D EXTREMITY Left 03/04/2014   Procedure: IRRIGATION AND DEBRIDEMENT KNEE ARTHROTOMY WITH REMOVAL OF BULLET FRAGMENTS;  Surgeon: Eldred Manges, MD;  Location: [facility] OR;  Service: Orthopedics;  Laterality: Left;  . KNEE SURGERY      Family History  Problem Relation Age of Onset  . Emphysema Paternal Grandmother     Social History  Substance Use Topics  . Smoking status: Former Smoker    Packs/day: 0.50    Types: Cigarettes  . Smokeless tobacco: Never Used  . Alcohol use No    Allergies:  Allergies  Allergen Reactions  . Orange Fruit [Citrus] Swelling    Swelling around the lips  . Penicillins Hives    Has patient had a PCN reaction causing immediate rash, facial/tongue/throat swelling, SOB or lightheadedness with hypotension: No Has patient had a PCN reaction causing severe rash involving mucus membranes or skin necrosis: No Has patient had a  PCN reaction that required hospitalization No Has patient had a PCN reaction occurring within the last 10 years: Yes If all of the above answers are "NO", then may proceed with Cephalosporin use.   . Tomato Swelling    Swelling around the lips    No prescriptions prior to admission.    Review of Systems  Constitutional: Negative for fever.  Gastrointestinal: Positive for abdominal pain.  Genitourinary: Positive for pelvic pain, vaginal bleeding and vaginal discharge.   Physical Exam   Blood pressure 110/73, pulse 82, temperature 98.5 F (36.9 C), temperature source Oral, resp. rate 16, height 5\' 6"  (1.676 m), weight 136 lb 8 oz (61.9 kg), last menstrual period 04/08/2017, not currently breastfeeding.  Physical Exam  Nursing note and vitals reviewed. Constitutional: She is oriented to person, place, and time. She appears well-developed and well-nourished. No distress.  HENT:  Head: Normocephalic and atraumatic.  Cardiovascular: Normal rate.   Respiratory: Effort normal.  GI: Soft. She exhibits no distension and no mass. There is no tenderness. There is no rebound and no guarding.  Genitourinary: Uterus is tender (mild). Uterus is not enlarged. Cervix exhibits no motion tenderness, no discharge and no friability. Right adnexum displays no mass and no tenderness. Left adnexum displays no mass and no tenderness. No  bleeding in the vagina. Vaginal discharge (moderate thin, white discharge) found.  Neurological: She is alert and oriented to person, place, and time.  Skin: Skin is warm and dry. No erythema.  Psychiatric: She has a normal mood and affect.     Results for orders placed or performed during the hospital encounter of 06/08/17 (from the past 24 hour(s))  Wet prep, genital     Status: Abnormal   Collection Time: 06/08/17  7:50 PM  Result Value Ref Range   Yeast Wet Prep HPF POC NONE SEEN NONE SEEN   Trich, Wet Prep NONE SEEN NONE SEEN   Clue Cells Wet Prep HPF POC PRESENT  (A) NONE SEEN   WBC, Wet Prep HPF POC MANY (A) NONE SEEN   Sperm NONE SEEN   Pregnancy, urine POC     Status: None   Collection Time: 06/08/17  8:16 PM  Result Value Ref Range   Preg Test, Ur NEGATIVE NEGATIVE     MAU Course  Procedures None  MDM UPT- negative UA, wet prep and GC/Chlamydia today   Assessment and Plan  A: Bacterial vaginosis   P: Discharge home Rx for Flagyl given to patient  Continue Ibuprofen PRN for pain  Discussed hygiene products to avoid for recurrence Patient advised to follow-up with [facility] or GYN provider of choice for routine GYN care Patient may return to MAU as needed or if her condition were to change or worsen  Vonzella NippleJulie Taegen Delker, PA-C 06/08/2017, 8:31 PM

## 2017-06-08 NOTE — MAU Note (Signed)
Patient presents to MAU with c/o vaginal discharge and pelvic pain. Patient has been complaining of this pain for the past week. Thinks it is an "infection"- changed partners a month ago. Complains of some spotting as well not consistently and believes that is due to her birth control- has nexplanon in place.

## 2017-06-08 NOTE — Discharge Instructions (Signed)
Bacterial Vaginosis Bacterial vaginosis is an infection of the vagina. It happens when too many germs (bacteria) grow in the vagina. This infection puts you at risk for infections from sex (STIs). Treating this infection can lower your risk for some STIs. You should also treat this if you are pregnant. It can cause your baby to be born early. Follow these instructions at home: Medicines  Take over-the-counter and prescription medicines only as told by your doctor.  Take or use your antibiotic medicine as told by your doctor. Do not stop taking or using it even if you start to feel better. General instructions  If you your sexual partner is a woman, tell her that you have this infection. She needs to get treatment if she has symptoms. If you have a female partner, he does not need to be treated.  During treatment: ? Avoid sex. ? Do not douche. ? Avoid alcohol as told. ? Avoid breastfeeding as told.  Drink enough fluid to keep your pee (urine) clear or pale yellow.  Keep your vagina and butt (rectum) clean. ? Wash the area with warm water every day. ? Wipe from front to back after you use the toilet.  Keep all follow-up visits as told by your doctor. This is important. Preventing this condition  Do not douche.  Use only warm water to wash around your vagina.  Use protection when you have sex. This includes: ? Latex condoms. ? Dental dams.  Limit how many people you have sex with. It is best to only have sex with the same person (be monogamous).  Get tested for STIs. Have your partner get tested.  Wear underwear that is cotton or lined with cotton.  Avoid tight pants and pantyhose. This is most important in summer.  Do not use any products that have nicotine or tobacco in them. These include cigarettes and e-cigarettes. If you need help quitting, ask your doctor.  Do not use illegal drugs.  Limit how much alcohol you drink. Contact a doctor if:  Your symptoms do not  get better, even after you are treated.  You have more discharge or pain when you pee (urinate).  You have a fever.  You have pain in your belly (abdomen).  You have pain with sex.  Your bleed from your vagina between periods. Summary  This infection happens when too many germs (bacteria) grow in the vagina.  Treating this condition can lower your risk for some infections from sex (STIs).  You should also treat this if you are pregnant. It can cause early (premature) birth.  Do not stop taking or using your antibiotic medicine even if you start to feel better. This information is not intended to replace advice given to you by your health care provider. Make sure you discuss any questions you have with your health care provider. Document Released: 05/12/2008 Document Revised: 04/18/2016 Document Reviewed: 04/18/2016 Elsevier Interactive Patient Education  2017 [employer].   In late 2019, the [facility] will be moving to the [facility] campus. At that time, the MAU will no longer serve non-pregnant patients. We encourage you to establish care with a provider before that time, so that you can be seen with any GYN concerns, like vaginal discharge, urinary tract infection, etc.. in a timely manner. In order to make the office visit more convenient, the Center for [facility] at [facility] will be offering evening hours from 4pm-7:30pm on Mondays starting 04/26/17. There will be same-day appointments, walk-in  appointments and scheduled appointments available during this time.    [facility] [phone]- [phone]  For urgent needs, [facility] also available for management of urgent GYN complaints such as vaginal discharge.

## 2017-06-09 LAB — GC/CHLAMYDIA PROBE AMP (~~LOC~~) NOT AT ARMC
Chlamydia: POSITIVE — AB
NEISSERIA GONORRHEA: NEGATIVE

## 2017-06-11 ENCOUNTER — Other Ambulatory Visit: Payer: Self-pay | Admitting: Advanced Practice Midwife

## 2017-06-11 DIAGNOSIS — A749 Chlamydial infection, unspecified: Secondary | ICD-10-CM | POA: Insufficient documentation

## 2017-06-11 MED ORDER — AZITHROMYCIN 500 MG PO TABS: 1000.0000 mg | ORAL_TABLET | Freq: Once | ORAL | 0 refills | Status: AC

## 2017-06-11 NOTE — Progress Notes (Signed)
Chlamydia positive Patient called very upset "that yall are letting me walk around like this" I explained that the result just got checked off and that the pool has not sent in the Rx since it has not been very long I put in Rx for Azithromycin due to patient's distress  Aviva SignsWilliams, Jakhia Buxton L, CNM

## 2018-03-02 ENCOUNTER — Other Ambulatory Visit: Payer: Self-pay

## 2018-03-02 ENCOUNTER — Encounter (HOSPITAL_BASED_OUTPATIENT_CLINIC_OR_DEPARTMENT_OTHER): Payer: Self-pay | Admitting: *Deleted

## 2018-03-02 ENCOUNTER — Emergency Department (HOSPITAL_BASED_OUTPATIENT_CLINIC_OR_DEPARTMENT_OTHER)
Admission: EM | Admit: 2018-03-02 | Discharge: 2018-03-02 | Disposition: A | Discharge status: Home / Self Care | Payer: BLUE CROSS/BLUE SHIELD | Attending: Emergency Medicine | Admitting: Emergency Medicine

## 2018-03-02 DIAGNOSIS — Z87891 Personal history of nicotine dependence: Secondary | ICD-10-CM | POA: Diagnosis not present

## 2018-03-02 DIAGNOSIS — F329 Major depressive disorder, single episode, unspecified: Secondary | ICD-10-CM | POA: Insufficient documentation

## 2018-03-02 DIAGNOSIS — F419 Anxiety disorder, unspecified: Secondary | ICD-10-CM | POA: Diagnosis not present

## 2018-03-02 DIAGNOSIS — H5789 Other specified disorders of eye and adnexa: Secondary | ICD-10-CM | POA: Diagnosis present

## 2018-03-02 DIAGNOSIS — H1032 Unspecified acute conjunctivitis, left eye: Secondary | ICD-10-CM

## 2018-03-02 MED ORDER — ERYTHROMYCIN 5 MG/GM OP OINT: TOPICAL_OINTMENT | Freq: Four times a day (QID) | OPHTHALMIC | 0 refills | Status: AC

## 2018-03-02 NOTE — ED Triage Notes (Signed)
Pt reports 2 others in her home have pink eye, she awoke this am with her left eye draining. Denies any injury or trauma, denies any fevers or other c/o.8
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
## 2019-03-14 ENCOUNTER — Emergency Department (HOSPITAL_COMMUNITY)
Admission: EM | Admit: 2019-03-14 | Discharge: 2019-03-14 | Discharge status: Against Medical Advice | Payer: BLUE CROSS/BLUE SHIELD | Attending: Emergency Medicine | Admitting: Emergency Medicine

## 2019-03-14 ENCOUNTER — Encounter: Payer: Self-pay | Admitting: Emergency Medicine

## 2019-03-14 ENCOUNTER — Emergency Department (HOSPITAL_BASED_OUTPATIENT_CLINIC_OR_DEPARTMENT_OTHER)
Admission: EM | Admit: 2019-03-14 | Discharge: 2019-03-14 | Disposition: A | Discharge status: Home / Self Care | Payer: Self-pay | Attending: Emergency Medicine | Admitting: Emergency Medicine

## 2019-03-14 ENCOUNTER — Encounter (HOSPITAL_BASED_OUTPATIENT_CLINIC_OR_DEPARTMENT_OTHER): Payer: Self-pay | Admitting: *Deleted

## 2019-03-14 ENCOUNTER — Other Ambulatory Visit: Payer: Self-pay

## 2019-03-14 DIAGNOSIS — Z87891 Personal history of nicotine dependence: Secondary | ICD-10-CM | POA: Insufficient documentation

## 2019-03-14 DIAGNOSIS — R51 Headache: Secondary | ICD-10-CM | POA: Insufficient documentation

## 2019-03-14 DIAGNOSIS — Z5321 Procedure and treatment not carried out due to patient leaving prior to being seen by health care provider: Secondary | ICD-10-CM | POA: Insufficient documentation

## 2019-03-14 DIAGNOSIS — R519 Headache, unspecified: Secondary | ICD-10-CM

## 2019-03-14 NOTE — ED Triage Notes (Signed)
Pt left [facility] without being seen due to long wait time. She is here for painful Knot in her scalp.

## 2019-03-14 NOTE — ED Triage Notes (Signed)
Pt arrives to ed with c/o of a "knot in her scalp that she can palpate and is painful" pt states this has been there for a few weeks but has became more tender to the point where she is unable to even  have her hair done. Pt states this may have came from having braids in her hair.

## 2019-03-14 NOTE — ED Notes (Signed)
Pt decided to leave, states she will go to an UC to be seen. Pt encouraged to stay, she reports she can not wait.
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
## 2019-03-14 NOTE — Discharge Instructions (Signed)
You may rotate tylenol and motrin for pain. You may use cold compresses for the pain. You were given information to follow up with a dermatologist as you may need to have a biopsy of this area if the pain continues or if the skin changes. You should call the office to schedule an appointment for follow up.   You may also follow up with your primary care provider within 5-7 days for re-evaluation of your symptoms. If you do not have a primary care provider, information for a healthcare clinic has been provided for you to make arrangements for follow up care. Please return to the emergency department for any new or worsening symptoms.

## 2020-01-10 ENCOUNTER — Other Ambulatory Visit: Payer: Self-pay

## 2020-01-10 ENCOUNTER — Encounter (HOSPITAL_BASED_OUTPATIENT_CLINIC_OR_DEPARTMENT_OTHER): Payer: Self-pay | Admitting: Emergency Medicine

## 2020-01-10 ENCOUNTER — Emergency Department (HOSPITAL_BASED_OUTPATIENT_CLINIC_OR_DEPARTMENT_OTHER): Payer: Medicaid Other

## 2020-01-10 ENCOUNTER — Emergency Department (HOSPITAL_BASED_OUTPATIENT_CLINIC_OR_DEPARTMENT_OTHER)
Admission: EM | Admit: 2020-01-10 | Discharge: 2020-01-10 | Disposition: A | Discharge status: Home / Self Care | Payer: Medicaid Other | Attending: Emergency Medicine | Admitting: Emergency Medicine

## 2020-01-10 DIAGNOSIS — Z3491 Encounter for supervision of normal pregnancy, unspecified, first trimester: Secondary | ICD-10-CM

## 2020-01-10 DIAGNOSIS — Z3A00 Weeks of gestation of pregnancy not specified: Secondary | ICD-10-CM | POA: Insufficient documentation

## 2020-01-10 DIAGNOSIS — B9689 Other specified bacterial agents as the cause of diseases classified elsewhere: Secondary | ICD-10-CM

## 2020-01-10 DIAGNOSIS — O23591 Infection of other part of genital tract in pregnancy, first trimester: Secondary | ICD-10-CM | POA: Insufficient documentation

## 2020-01-10 DIAGNOSIS — Z87891 Personal history of nicotine dependence: Secondary | ICD-10-CM | POA: Insufficient documentation

## 2020-01-10 DIAGNOSIS — R102 Pelvic and perineal pain: Secondary | ICD-10-CM

## 2020-01-10 LAB — WET PREP, GENITAL
Sperm: NONE SEEN
Yeast Wet Prep HPF POC: NONE SEEN

## 2020-01-10 LAB — CBC WITH DIFFERENTIAL/PLATELET
Abs Immature Granulocytes: 0.02 10*3/uL (ref 0.00–0.07)
Basophils Absolute: 0.1 10*3/uL (ref 0.0–0.1)
Basophils Relative: 1 %
Eosinophils Absolute: 0.1 10*3/uL (ref 0.0–0.5)
Eosinophils Relative: 2 %
HCT: 37.2 % (ref 36.0–46.0)
Hemoglobin: 11.5 g/dL — ABNORMAL LOW (ref 12.0–15.0)
Immature Granulocytes: 0 %
Lymphocytes Relative: 26 %
Lymphs Abs: 1.6 10*3/uL (ref 0.7–4.0)
MCH: 22.8 pg — ABNORMAL LOW (ref 26.0–34.0)
MCHC: 30.9 g/dL (ref 30.0–36.0)
MCV: 73.7 fL — ABNORMAL LOW (ref 80.0–100.0)
Monocytes Absolute: 0.4 10*3/uL (ref 0.1–1.0)
Monocytes Relative: 7 %
Neutro Abs: 4 10*3/uL (ref 1.7–7.7)
Neutrophils Relative %: 64 %
Platelets: 418 10*3/uL — ABNORMAL HIGH (ref 150–400)
RBC: 5.05 MIL/uL (ref 3.87–5.11)
RDW: 14.6 % (ref 11.5–15.5)
WBC: 6.2 10*3/uL (ref 4.0–10.5)
nRBC: 0 % (ref 0.0–0.2)

## 2020-01-10 LAB — BASIC METABOLIC PANEL
Anion gap: 9 (ref 5–15)
BUN: 11 mg/dL (ref 6–20)
CO2: 22 mmol/L (ref 22–32)
Calcium: 8.8 mg/dL — ABNORMAL LOW (ref 8.9–10.3)
Chloride: 103 mmol/L (ref 98–111)
Creatinine, Ser: 0.72 mg/dL (ref 0.44–1.00)
GFR calc Af Amer: >60 mL/min (ref >60)
GFR calc non Af Amer: >60 mL/min (ref >60)
Glucose, Bld: 93 mg/dL (ref 70–99)
Potassium: 3.6 mmol/L (ref 3.5–5.1)
Sodium: 134 mmol/L — ABNORMAL LOW (ref 135–145)

## 2020-01-10 LAB — URINALYSIS, ROUTINE W REFLEX MICROSCOPIC
Bilirubin Urine: NEGATIVE
Glucose, UA: NEGATIVE mg/dL
Hgb urine dipstick: NEGATIVE
Ketones, ur: NEGATIVE mg/dL
Nitrite: NEGATIVE
Protein, ur: NEGATIVE mg/dL
Specific Gravity, Urine: >1.03 — ABNORMAL HIGH (ref 1.005–1.030)
pH: 6 (ref 5.0–8.0)

## 2020-01-10 LAB — URINALYSIS, MICROSCOPIC (REFLEX)

## 2020-01-10 LAB — HCG, QUANTITATIVE, PREGNANCY: hCG, Beta Chain, Quant, S: 13009 m[IU]/mL — ABNORMAL HIGH (ref <5)

## 2020-01-10 LAB — HIV ANTIBODY (ROUTINE TESTING W REFLEX): HIV Screen 4th Generation wRfx: NONREACTIVE

## 2020-01-10 LAB — PREGNANCY, URINE: Preg Test, Ur: POSITIVE — AB

## 2020-01-10 MED ORDER — METRONIDAZOLE 500 MG PO TABS
500.0000 mg | ORAL_TABLET | Freq: Once | ORAL | Status: AC
  Administered 2020-01-10: 500 mg via ORAL
  Filled 2020-01-10: qty 1

## 2020-01-10 MED ORDER — METRONIDAZOLE 500 MG PO TABS
500.0000 mg | ORAL_TABLET | Freq: Two times a day (BID) | ORAL | 0 refills | Status: DC
Start: 2020-01-10 — End: 2020-06-24

## 2020-01-10 MED FILL — METRONIDAZOLE 500 MG TABS: 500 | 7 days supply | Qty: 13 | Fill #0

## 2020-01-10 NOTE — Discharge Instructions (Signed)
Your ultrasound shows a probable early pregnancy, but the ultrasound can't see everything. You need a repeat ultrasound in less than 2 weeks by your OB/GYN. It would be best to call your OB tomorrow to set all this up.   If you develop new or worsening abdominal pain, vomiting, or any other new/concerning symptoms then return to the ER for evaluation.

## 2020-01-10 NOTE — ED Triage Notes (Signed)
Low abd pain x5 days. Urinary frequency.  Also HA.
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
## 2020-01-11 ENCOUNTER — Other Ambulatory Visit: Payer: Self-pay | Admitting: Medical

## 2020-01-11 DIAGNOSIS — A749 Chlamydial infection, unspecified: Secondary | ICD-10-CM

## 2020-01-11 LAB — GC/CHLAMYDIA PROBE AMP (~~LOC~~) NOT AT ARMC
Chlamydia: POSITIVE — AB
Comment: NEGATIVE
Comment: NORMAL
Neisseria Gonorrhea: NEGATIVE

## 2020-01-11 LAB — RPR: RPR Ser Ql: NONREACTIVE

## 2020-01-11 LAB — URINE CULTURE: Culture: <10000 — AB

## 2020-01-11 MED ORDER — AZITHROMYCIN 250 MG PO TABS: 1000.0000 mg | ORAL_TABLET | Freq: Once | ORAL | 0 refills | Status: AC

## 2020-01-23 ENCOUNTER — Encounter (HOSPITAL_BASED_OUTPATIENT_CLINIC_OR_DEPARTMENT_OTHER): Payer: Self-pay

## 2020-01-23 ENCOUNTER — Other Ambulatory Visit: Payer: Self-pay

## 2020-01-23 ENCOUNTER — Emergency Department (HOSPITAL_BASED_OUTPATIENT_CLINIC_OR_DEPARTMENT_OTHER)
Admission: EM | Admit: 2020-01-23 | Discharge: 2020-01-23 | Disposition: A | Discharge status: Home / Self Care | Payer: BLUE CROSS/BLUE SHIELD | Attending: Emergency Medicine | Admitting: Emergency Medicine

## 2020-01-23 DIAGNOSIS — Z87891 Personal history of nicotine dependence: Secondary | ICD-10-CM | POA: Diagnosis not present

## 2020-01-23 DIAGNOSIS — O219 Vomiting of pregnancy, unspecified: Secondary | ICD-10-CM | POA: Diagnosis present

## 2020-01-23 DIAGNOSIS — Z3A01 Less than 8 weeks gestation of pregnancy: Secondary | ICD-10-CM | POA: Diagnosis not present

## 2020-01-23 DIAGNOSIS — Z91018 Allergy to other foods: Secondary | ICD-10-CM | POA: Diagnosis not present

## 2020-01-23 DIAGNOSIS — Z880 Allergy status to penicillin: Secondary | ICD-10-CM | POA: Insufficient documentation

## 2020-01-23 DIAGNOSIS — O210 Mild hyperemesis gravidarum: Secondary | ICD-10-CM | POA: Diagnosis not present

## 2020-01-23 MED ORDER — ONDANSETRON 8 MG PO TBDP
ORAL_TABLET | ORAL | 0 refills | Status: DC
Start: 2020-01-23 — End: 2020-07-30

## 2020-01-23 MED ORDER — ONDANSETRON 4 MG PO TBDP
8.0000 mg | ORAL_TABLET | Freq: Once | ORAL | Status: AC
  Administered 2020-01-23: 8 mg via ORAL
  Filled 2020-01-23: qty 2

## 2020-01-23 NOTE — ED Notes (Signed)
Per EDP order, pt given fluids and/or food for PO challenge. Pt verbalized understanding to utilize call bell if nausea or emesis occur. 
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
## 2020-01-23 NOTE — ED Notes (Signed)
Pharmacy and medications updated with patient 

## 2020-01-23 NOTE — ED Notes (Signed)
ED Provider at bedside. 

## 2020-01-23 NOTE — ED Triage Notes (Signed)
Pt states she was recently notified she was pregnant-having cont'd n/v with weight loss-NAD-steady gait

## 2020-01-23 NOTE — Discharge Instructions (Addendum)
Begin taking Zofran as needed for nausea.  Follow-up with your OB in the next week for a recheck, and return to the ER if symptoms significantly worsen or change.

## 2020-02-12 LAB — OB RESULTS CONSOLE HEPATITIS B SURFACE ANTIGEN: Hepatitis B Surface Ag: NEGATIVE

## 2020-02-12 LAB — OB RESULTS CONSOLE RUBELLA ANTIBODY, IGM: Rubella: IMMUNE

## 2020-06-23 ENCOUNTER — Other Ambulatory Visit: Payer: Self-pay

## 2020-06-23 ENCOUNTER — Encounter (HOSPITAL_COMMUNITY): Payer: Self-pay | Admitting: Obstetrics and Gynecology

## 2020-06-23 ENCOUNTER — Inpatient Hospital Stay (HOSPITAL_COMMUNITY)
Admission: AD | Admit: 2020-06-23 | Discharge: 2020-06-23 | Disposition: A | Discharge status: Home / Self Care | Payer: BLUE CROSS/BLUE SHIELD | Attending: Obstetrics and Gynecology | Admitting: Obstetrics and Gynecology

## 2020-06-23 DIAGNOSIS — U071 COVID-19: Secondary | ICD-10-CM | POA: Diagnosis not present

## 2020-06-23 DIAGNOSIS — R102 Pelvic and perineal pain: Secondary | ICD-10-CM | POA: Diagnosis present

## 2020-06-23 DIAGNOSIS — Z87891 Personal history of nicotine dependence: Secondary | ICD-10-CM | POA: Insufficient documentation

## 2020-06-23 DIAGNOSIS — Z3A29 29 weeks gestation of pregnancy: Secondary | ICD-10-CM | POA: Diagnosis not present

## 2020-06-23 DIAGNOSIS — O98519 Other viral diseases complicating pregnancy, unspecified trimester: Secondary | ICD-10-CM

## 2020-06-23 DIAGNOSIS — O98513 Other viral diseases complicating pregnancy, third trimester: Secondary | ICD-10-CM | POA: Diagnosis not present

## 2020-06-23 DIAGNOSIS — R059 Cough, unspecified: Secondary | ICD-10-CM | POA: Insufficient documentation

## 2020-06-23 DIAGNOSIS — Z880 Allergy status to penicillin: Secondary | ICD-10-CM | POA: Diagnosis not present

## 2020-06-23 DIAGNOSIS — R519 Headache, unspecified: Secondary | ICD-10-CM | POA: Diagnosis not present

## 2020-06-23 DIAGNOSIS — O26893 Other specified pregnancy related conditions, third trimester: Secondary | ICD-10-CM | POA: Diagnosis not present

## 2020-06-23 LAB — URINALYSIS, ROUTINE W REFLEX MICROSCOPIC
Bilirubin Urine: NEGATIVE
Glucose, UA: NEGATIVE mg/dL
Hgb urine dipstick: NEGATIVE
Ketones, ur: 80 mg/dL — AB
Leukocytes,Ua: NEGATIVE
Nitrite: NEGATIVE
Protein, ur: NEGATIVE mg/dL
Specific Gravity, Urine: 1.017 (ref 1.005–1.030)
pH: 7 (ref 5.0–8.0)

## 2020-06-23 LAB — RESP PANEL BY RT PCR (RSV, FLU A&B, COVID)
Influenza A by PCR: NEGATIVE
Influenza B by PCR: NEGATIVE
Respiratory Syncytial Virus by PCR: NEGATIVE
SARS Coronavirus 2 by RT PCR: POSITIVE — AB

## 2020-06-23 LAB — WET PREP, GENITAL
Clue Cells Wet Prep HPF POC: NONE SEEN
Sperm: NONE SEEN
Trich, Wet Prep: NONE SEEN
Yeast Wet Prep HPF POC: NONE SEEN

## 2020-06-23 MED ORDER — EPINEPHRINE 0.3 MG/0.3ML IJ SOAJ
0.3000 mg | Freq: Once | INTRAMUSCULAR | Status: DC | PRN
  Filled 2020-06-23: qty 0.6

## 2020-06-23 MED ORDER — DIPHENHYDRAMINE HCL 50 MG/ML IJ SOLN: 50.0000 mg | Freq: Once | INTRAMUSCULAR | Status: DC | PRN

## 2020-06-23 MED ORDER — SODIUM CHLORIDE 0.9 % IV SOLN
Freq: Once | INTRAVENOUS | Status: AC
  Filled 2020-06-23 (×2): qty 20

## 2020-06-23 MED ORDER — METHYLPREDNISOLONE SODIUM SUCC 125 MG IJ SOLR: 125.0000 mg | Freq: Once | INTRAMUSCULAR | Status: DC | PRN

## 2020-06-23 MED ORDER — FAMOTIDINE IN NACL 20-0.9 MG/50ML-% IV SOLN: 20.0000 mg | Freq: Once | INTRAVENOUS | Status: DC | PRN

## 2020-06-23 MED ORDER — LACTATED RINGERS IV BOLUS
1000.0000 mL | Freq: Once | INTRAVENOUS | Status: AC
  Administered 2020-06-23: 1000 mL via INTRAVENOUS

## 2020-06-23 MED ORDER — SODIUM CHLORIDE 0.9 % IV SOLN: 1200.0000 mg | Freq: Once | INTRAVENOUS | Status: DC

## 2020-06-23 MED ORDER — SODIUM CHLORIDE 0.9 % IV SOLN
INTRAVENOUS | Status: DC | PRN
  Administered 2020-06-23: 1000 mL via INTRAVENOUS

## 2020-06-23 MED ORDER — ALBUTEROL SULFATE HFA 108 (90 BASE) MCG/ACT IN AERS
2.0000 | INHALATION_SPRAY | Freq: Once | RESPIRATORY_TRACT | Status: DC | PRN
  Filled 2020-06-23: qty 6.7

## 2020-06-23 MED ORDER — ACETAMINOPHEN 500 MG PO TABS
1000.0000 mg | ORAL_TABLET | Freq: Once | ORAL | Status: AC
  Administered 2020-06-23: 1000 mg via ORAL
  Filled 2020-06-23: qty 2

## 2020-06-23 NOTE — MAU Note (Signed)
Pt tolerating antibody infusion without reaction. RN remains at bedside

## 2020-06-23 NOTE — MAU Note (Signed)
Pt tolerated infusion without reaction. Will observe x 1 hour and then discharge to home.

## 2020-06-23 NOTE — MAU Note (Addendum)
Antibody infusion began. Review medication risk and benefit. Pt continues to be agreeable for infusion administration. Primary RN remains at bedside.

## 2020-06-23 NOTE — MAU Note (Signed)
Pt requesting Tylenol for headache- CNM notified

## 2020-06-23 NOTE — MAU Note (Signed)
Dawn Proctor is a 24 y.o. at [redacted]w[redacted]d here in MAU reporting: pelvic pain and leg pain for the past week. It got worse last night. States she also has had a headache since last night. Also has been coughing, no sore throat or fever. No loss of smell or taste. No bleeding or LOF. +FM  Onset of complaint: ongoing but worse  Pain score: headache 8/10, leg pain 9/10, pelvic pain 7/10  Vitals:   06/23/20 1741  BP: (!) 94/52  Pulse: (!) 108  Resp: 16  Temp: 98.6 F (37 C)  SpO2: 100%     FHT: +FM  Lab orders placed from triage: UA

## 2020-06-23 NOTE — MAU Note (Signed)
Pt reports improvement in headache and lower extremity pain following Tylenol administration

## 2020-06-23 NOTE — MAU Provider Note (Addendum)
History     CSN: [id number]  Arrival date and time: 06/23/20 1726   First Provider Initiated Contact with Patient 06/23/20 1809      Chief Complaint  Patient presents with   Headache   Cough   Leg Pain   Pelvic Pain   HPI  Dawn Proctor is a 24 y.o. G4P1011 at [redacted]w[redacted]d who presents with pelvic pain. She states this has been going for about a week but got worse last night. She rates the pain a 6/10 and has not taken anything for the pain. She states the pain is cramping and comes and goes. She also reports pain in her leg but is unable to describe where the pain is or what it feels like. She states she has not had any water today, eats very little food and has not had a bowel movement in several days. She states this is all because of her nausea and vomiting. She denies any vaginal discharge, leaking of fluid or bleeding. She reports normal fetal movement. She states she last had intercourse last night but the pain was there before intercourse.   Patient also reports a cough and headache. She states these symptoms started yesterday as well. She states the headache makes her eyes hurt and is sensitive to light. She denies any history of migraines. She states she wants to be tested for COVID-19 because she went to a large event and was masked "most of the time." She has not been vaccinated for COVID-19.   OB History     Gravida  4   Para  1   Term  1   Preterm      AB  1   Living  1      SAB      TAB  1   Ectopic      Multiple  0   Live Births  1           Past Medical History:  Diagnosis Date   Allergy    Anxiety    Headache    Knee fracture 2015   Seasonal allergies     Past Surgical History:  Procedure Laterality Date   I & D EXTREMITY Left 03/04/2014   Procedure: IRRIGATION AND DEBRIDEMENT KNEE ARTHROTOMY WITH REMOVAL OF BULLET FRAGMENTS;  Surgeon: Eldred Manges, MD;  Location: [facility] OR;  Service: Orthopedics;  Laterality: Left;   KNEE SURGERY       Family History  Problem Relation Age of Onset   Emphysema Paternal Grandmother     Social History   Tobacco Use   Smoking status: Former Smoker    Packs/day: 0.50    Types: Cigarettes   Smokeless tobacco: Never Used  Vaping Use   Vaping Use: Never used  Substance Use Topics   Alcohol use: Not Currently   Drug use: No    Allergies:  Allergies  Allergen Reactions   Orange Fruit [Citrus] Swelling    Swelling around the lips   Penicillins Hives    Has patient had a PCN reaction causing immediate rash, facial/tongue/throat swelling, SOB or lightheadedness with hypotension: No Has patient had a PCN reaction causing severe rash involving mucus membranes or skin necrosis: No Has patient had a PCN reaction that required hospitalization No Has patient had a PCN reaction occurring within the last 10 years: Yes If all of the above answers are "NO", then may proceed with Cephalosporin use.    Tomato Swelling    Swelling around the  lips    Medications Prior to Admission  Medication Sig Dispense Refill Last Dose   triamcinolone (KENALOG) 0.025 % cream Apply 1 application topically 2 (two) times daily.   06/23/2020 at Unknown time   metroNIDAZOLE (FLAGYL) 500 MG tablet Take 1 tablet (500 mg total) by mouth 2 (two) times daily. One po bid x 7 days 13 tablet 0    ondansetron (ZOFRAN ODT) 8 MG disintegrating tablet 8mg  ODT q4 hours prn nausea 10 tablet 0     Review of Systems  Constitutional: Negative.  Negative for fatigue and fever.  HENT: Negative.   Respiratory: Positive for cough. Negative for shortness of breath.   Cardiovascular: Negative.  Negative for chest pain.  Gastrointestinal: Positive for constipation and nausea. Negative for abdominal pain, diarrhea and vomiting.  Genitourinary: Positive for pelvic pain. Negative for dysuria, vaginal bleeding and vaginal discharge.  Musculoskeletal:       Leg pain  Neurological: Positive for headaches. Negative for dizziness.    Physical Exam   Blood pressure (!) 94/52, pulse (!) 108, temperature 98.6 F (37 C), temperature source Oral, resp. rate 16, height 5\' 6"  (1.676 m), weight 65.8 kg, last menstrual period 12/03/2019, SpO2 100 %.  Physical Exam Vitals and nursing note reviewed.  Constitutional:      General: She is not in acute distress.    Appearance: She is well-developed.  HENT:     Head: Normocephalic.  Eyes:     Pupils: Pupils are equal, round, and reactive to light.  Cardiovascular:     Rate and Rhythm: Normal rate and regular rhythm.     Heart sounds: Normal heart sounds.  Pulmonary:     Effort: Pulmonary effort is normal. No respiratory distress.     Breath sounds: Normal breath sounds.  Abdominal:     General: Bowel sounds are normal. There is no distension.     Palpations: Abdomen is soft.     Tenderness: There is no abdominal tenderness.  Skin:    General: Skin is warm and dry.  Neurological:     Mental Status: She is alert and oriented to person, place, and time.  Psychiatric:        Behavior: Behavior normal.        Thought Content: Thought content normal.        Judgment: Judgment normal.     Dilation: Closed Effacement (%): Thick  Fetal Tracing:  Baseline: 150 Variability: moderate Accels: 15x15 Decels: none  Toco: none  MAU Course  Procedures Results for orders placed or performed during the hospital encounter of 06/23/20 (from the past 24 hour(s))  Wet prep, genital     Status: Abnormal   Collection Time: 06/23/20  6:23 PM   Specimen: Vaginal  Result Value Ref Range   Yeast Wet Prep HPF POC NONE SEEN NONE SEEN   Trich, Wet Prep NONE SEEN NONE SEEN   Clue Cells Wet Prep HPF POC NONE SEEN NONE SEEN   WBC, Wet Prep HPF POC MODERATE (A) NONE SEEN   Sperm NONE SEEN   Urinalysis, Routine w reflex microscopic Urine, Clean Catch     Status: Abnormal   Collection Time: 06/23/20  7:36 PM  Result Value Ref Range   Color, Urine YELLOW YELLOW   APPearance CLOUDY (A)  CLEAR   Specific Gravity, Urine 1.017 1.005 - 1.030   pH 7.0 5.0 - 8.0   Glucose, UA NEGATIVE NEGATIVE mg/dL   Hgb urine dipstick NEGATIVE NEGATIVE   Bilirubin Urine NEGATIVE  NEGATIVE   Ketones, ur 80 (A) NEGATIVE mg/dL   Protein, ur NEGATIVE NEGATIVE mg/dL   Nitrite NEGATIVE NEGATIVE   Leukocytes,Ua NEGATIVE NEGATIVE     MDM UA Wet prep and gc/chlamydia Unable to do FFN due to recent intercourse LR bolus  Respiratory panel swab Tylenol PO- patient reports complete resolution of pain. States this is the first time she is able to comfortably   COVID-19 swab positive. Results reviewed with patient. Patient appropriately upset and fearful. Offered MAB infusion while in MAU tonight and patient desires.   Dr. Ellyn Hack notified of patient arrival and diagnosis of COVID-19. Dr. Ellyn Hack to cancel patient's appointment this week and will reschedule  Lengthy discussion with patient about quarantining at home and treating symptomatically. Reviewed warning signs and when to return to MAU. Patient verbalized understanding and will not go to [facility] office this week and will stay home exclusively. Encouraged patient to have partner tested.   Assessment and Plan   1. COVID-19 affecting pregnancy, antepartum   2. Pelvic pain affecting pregnancy in third trimester, antepartum   3. Pregnancy headache in third trimester   4. [redacted] weeks gestation of pregnancy    -Discharge home in stable condition --S/p MAB infusion prior to MAU discharge -COVID precautions discussed -Patient advised to follow-up with OB when rescheduled -Patient may return to MAU as needed or if her condition were to change or worsen   Rolm Bookbinder CNM 06/23/2020, 9:00pm   Report received from Cleone Slim, CNM at 2100 hours. MAB infusion without complication. Tolerated well by patient. No concerning events on hour s/p infusion. Discharge home in stable condition with plan of care as above.  Clayton Bibles, MSN,  CNM Certified Nurse Midwife, [employer] for [employer], [facility] 06/24/20 12:27 AM

## 2020-06-23 NOTE — Discharge Instructions (Signed)

## 2020-06-24 LAB — GC/CHLAMYDIA PROBE AMP (~~LOC~~) NOT AT ARMC
Chlamydia: NEGATIVE
Comment: NEGATIVE
Comment: NORMAL
Neisseria Gonorrhea: NEGATIVE

## 2020-07-22 ENCOUNTER — Other Ambulatory Visit (HOSPITAL_COMMUNITY): Payer: Self-pay | Admitting: *Deleted

## 2020-07-23 ENCOUNTER — Inpatient Hospital Stay (HOSPITAL_COMMUNITY)
Admission: RE | Admit: 2020-07-23 | Discharge: 2020-07-23 | Disposition: A | Discharge status: Home / Self Care | Payer: BLUE CROSS/BLUE SHIELD | Source: Ambulatory Visit | Attending: Obstetrics and Gynecology | Admitting: Obstetrics and Gynecology

## 2020-07-29 ENCOUNTER — Other Ambulatory Visit: Payer: Self-pay

## 2020-07-29 ENCOUNTER — Other Ambulatory Visit: Payer: Self-pay | Admitting: Family Medicine

## 2020-07-29 ENCOUNTER — Inpatient Hospital Stay (HOSPITAL_COMMUNITY)
Admission: AD | Admit: 2020-07-29 | Discharge: 2020-07-29 | Disposition: A | Discharge status: Home / Self Care | Payer: BLUE CROSS/BLUE SHIELD | Attending: Obstetrics and Gynecology | Admitting: Obstetrics and Gynecology

## 2020-07-29 ENCOUNTER — Encounter (HOSPITAL_COMMUNITY): Payer: Self-pay | Admitting: Obstetrics and Gynecology

## 2020-07-29 DIAGNOSIS — O4703 False labor before 37 completed weeks of gestation, third trimester: Secondary | ICD-10-CM | POA: Diagnosis present

## 2020-07-29 DIAGNOSIS — Z3A34 34 weeks gestation of pregnancy: Secondary | ICD-10-CM | POA: Diagnosis not present

## 2020-07-29 DIAGNOSIS — Z87891 Personal history of nicotine dependence: Secondary | ICD-10-CM | POA: Insufficient documentation

## 2020-07-29 MED ORDER — BETAMETHASONE SOD PHOS & ACET 6 (3-3) MG/ML IJ SUSP: 12.0000 mg | Freq: Once | INTRAMUSCULAR | Status: DC

## 2020-07-29 MED ORDER — BETAMETHASONE SOD PHOS & ACET 6 (3-3) MG/ML IJ SUSP
12.0000 mg | Freq: Once | INTRAMUSCULAR | Status: AC
  Administered 2020-07-29: 12 mg via INTRAMUSCULAR
  Filled 2020-07-29: qty 5

## 2020-07-29 NOTE — MAU Provider Note (Signed)
History     CSN: [id number]  Arrival date and time: 07/29/20 1513   None     Chief Complaint  Patient presents with  . steroid    HPI 24 yo G3P1011 at 34 weeks 1 day. Sent her for BMZ due to cervical dilation. She c/o pelvic pressure and cervix was dilated to 3cm. Reports no contractions or cramping, leaking fluid, vaginal bleeding. Good fetal activity.  OB History    Gravida  4   Para  1   Term  1   Preterm      AB  1   Living  1     SAB      IAB  1   Ectopic      Multiple  0   Live Births  1           Past Medical History:  Diagnosis Date  . Allergy   . Anxiety   . Headache   . Knee fracture 2015  . Seasonal allergies     Past Surgical History:  Procedure Laterality Date  . I & D EXTREMITY Left 03/04/2014   Procedure: IRRIGATION AND DEBRIDEMENT KNEE ARTHROTOMY WITH REMOVAL OF BULLET FRAGMENTS;  Surgeon: Eldred Manges, MD;  Location: [facility] OR;  Service: Orthopedics;  Laterality: Left;  . KNEE SURGERY      Family History  Problem Relation Age of Onset  . Emphysema Paternal Grandmother     Social History   Tobacco Use  . Smoking status: Former Smoker    Packs/day: 0.50    Types: Cigarettes  . Smokeless tobacco: Never Used  Vaping Use  . Vaping Use: Never used  Substance Use Topics  . Alcohol use: Not Currently  . Drug use: No    Allergies:  Allergies  Allergen Reactions  . Orange Fruit [Citrus] Swelling    Swelling around the lips  . Penicillins Hives    Has patient had a PCN reaction causing immediate rash, facial/tongue/throat swelling, SOB or lightheadedness with hypotension: No Has patient had a PCN reaction causing severe rash involving mucus membranes or skin necrosis: No Has patient had a PCN reaction that required hospitalization No Has patient had a PCN reaction occurring within the last 10 years: Yes If all of the above answers are "NO", then may proceed with Cephalosporin use.   . Tomato Swelling    Swelling around  the lips    Medications Prior to Admission  Medication Sig Dispense Refill Last Dose  . ondansetron (ZOFRAN ODT) 8 MG disintegrating tablet 8mg  ODT q4 hours prn nausea 10 tablet 0     Review of Systems Physical Exam   Blood pressure (!) 103/58, pulse 74, temperature 98.6 F (37 C), temperature source Oral, resp. rate 16, height 5\' 6"  (1.676 m), weight 68.7 kg, last menstrual period 12/03/2019, SpO2 100 %.  Physical Exam Vitals reviewed.  Constitutional:      Appearance: Normal appearance.  Pulmonary:     Effort: Pulmonary effort is normal.  Abdominal:     General: Abdomen is flat. There is no distension.     Palpations: Abdomen is soft. There is no mass.     Tenderness: There is no abdominal tenderness. There is no guarding.     Hernia: No hernia is present.  Neurological:     Mental Status: She is alert.  Psychiatric:        Mood and Affect: Mood normal.        Behavior: Behavior normal.  Thought Content: Thought content normal.        Judgment: Judgment normal.     MAU Course  Procedures NST Reactive NST  MDM   Assessment and Plan     ICD-10-CM   1. [redacted] weeks gestation of pregnancy  Z3A.34   2. Threatened preterm labor, third trimester  O47.03    BMZ given. No contractions on the monitor. Discharged to home with return precautions.  Levie Heritage 07/29/2020, 3:51 PM

## 2020-07-29 NOTE — Discharge Instructions (Signed)

## 2020-07-29 NOTE — MAU Note (Signed)
Pt sent from office for BMTZ. States she was 3cm at office today. She has irregular contractions and some pressure when walking. Denies VB. +FM

## 2020-07-30 ENCOUNTER — Encounter (HOSPITAL_COMMUNITY): Payer: Self-pay | Admitting: Obstetrics and Gynecology

## 2020-07-30 ENCOUNTER — Inpatient Hospital Stay (HOSPITAL_COMMUNITY)
Admission: AD | Admit: 2020-07-30 | Discharge: 2020-07-30 | Disposition: A | Discharge status: Home / Self Care | Payer: BLUE CROSS/BLUE SHIELD | Attending: Obstetrics and Gynecology | Admitting: Obstetrics and Gynecology

## 2020-07-30 ENCOUNTER — Other Ambulatory Visit: Payer: Self-pay

## 2020-07-30 DIAGNOSIS — Z3A34 34 weeks gestation of pregnancy: Secondary | ICD-10-CM

## 2020-07-30 DIAGNOSIS — Z87891 Personal history of nicotine dependence: Secondary | ICD-10-CM | POA: Diagnosis not present

## 2020-07-30 DIAGNOSIS — Z3689 Encounter for other specified antenatal screening: Secondary | ICD-10-CM

## 2020-07-30 DIAGNOSIS — Z880 Allergy status to penicillin: Secondary | ICD-10-CM | POA: Diagnosis not present

## 2020-07-30 DIAGNOSIS — O4703 False labor before 37 completed weeks of gestation, third trimester: Secondary | ICD-10-CM

## 2020-07-30 MED ORDER — BETAMETHASONE SOD PHOS & ACET 6 (3-3) MG/ML IJ SUSP
12.0000 mg | Freq: Once | INTRAMUSCULAR | Status: AC
  Administered 2020-07-30: 12 mg via INTRAMUSCULAR

## 2020-07-30 NOTE — MAU Note (Signed)
Presents to receive 2nd dose of BMZ.  Also requesting cervical exam, states had 2 ctxs, not close together.  Denies VB.  Endorses +FM.

## 2020-07-30 NOTE — Discharge Instructions (Signed)
Activity Restriction During Pregnancy Your health care provider may recommend specific activity restrictions during pregnancy for a variety of reasons. Activity restriction may require that you limit activities that require great effort, such as exercise, lifting, or sex. The type of activity restriction will vary for each person, depending on your risk or the problems you are having. Activity restriction may be recommended for a period of time until your baby is delivered. Why are activity restrictions recommended? Activity restriction may be recommended if:  Your placenta is partially or completely covering the opening of your cervix (placenta previa).  There is bleeding between the wall of the uterus and the amniotic sac in the first trimester of pregnancy (subchorionic hemorrhage).  You went into labor too early (preterm labor).  You have a history of miscarriage.  You have a condition that causes high blood pressure during pregnancy (preeclampsia or eclampsia).  You are pregnant with more than one baby.  Your baby is not growing well. What are the risks? The risks depend on your specific restriction. Strict bed rest has the most physical and emotional risks and is no longer routinely recommended. Risks of strict bed rest include:  Loss of muscle conditioning from not moving.  Blood clots.  Social isolation.  Depression.  Loss of income. Talk with your health care team about activity restriction to decide if it is best for you and your baby. Even if you are having problems during your pregnancy, you may be able to continue with normal levels of activity with careful monitoring by your health care team. Follow these instructions at home: If needed, based on your overall health and the health of your baby, your health care provider will decide which type of activity restriction is right for you. Activity restrictions may include:  Not lifting anything heavier than 10 pounds (4.5  kg).  Avoiding activities that take a lot of physical effort.  No lifting or straining.  Resting in a sitting position or lying down for periods of time during the day. Pelvic rest may be recommended along with activity restrictions. If pelvic rest is recommended, then:  Do not have sex, an orgasm, or use sexual stimulation.  Do not use tampons. Do not douche. Do not put anything into your vagina.  Do not lift anything that is heavier than 10 lb (4.5 kg).  Avoid activities that require a lot of effort.  Avoid any activity in which your pelvic muscles could become strained, such as squatting. Questions to ask your health care provider  Why is my activity being limited?  How will activity restrictions affect my body?  Why is rest helpful for me and my baby?  What activities can I do?  When can I return to normal activities? When should I seek immediate medical care? Seek immediate medical care if you have:  Vaginal bleeding.  Vaginal discharge.  Cramping pain in your lower abdomen.  Regular contractions.  A low, dull backache. Summary  Your health care provider may recommend specific activity restrictions during pregnancy for a variety of reasons.  Activity restriction may require that you limit activities such as exercise, lifting, sex, or any other activity that requires great effort.  Discuss the risks and benefits of activity restriction with your health care team to decide if it is best for you and your baby.  Contact your health care provider right away if you think you are having contractions, or if you notice vaginal bleeding, discharge, or cramping. This information is not   intended to replace advice given to you by your health care provider. Make sure you discuss any questions you have with your health care provider. Document Revised: 04/26/2019 Document Reviewed: 11/23/2017 Elsevier Patient Education  2020 [employer]  

## 2020-07-30 NOTE — MAU Provider Note (Signed)
History     CSN: [id number]  Arrival date and time: 07/30/20 1506   Event Date/Time   First Provider Initiated Contact with Patient 07/30/20 1552      Chief Complaint  Patient presents with  . Contractions  . Betamethasone Injection   HPI Dawn Proctor is a 24 y.o. [id number] at [redacted]w[redacted]d who originally presented to MAU for her second Betamethasone. She was previously determined to be 3/60/-2. Patient states she moved residences yesterday and endorses recurrent mild contractions. She "isn't sure if they are contractions". She is requesting a cervical exam.She denies vaginal bleeding, leaking of fluid, decreased fetal movement, fever, falls, or recent illness.   She receives care with [facility]. Her next appointment is tomorrow.  OB History    Gravida  4   Para  1   Term  1   Preterm      AB  2   Living  1     SAB      IAB  2   Ectopic      Multiple  0   Live Births  1           Past Medical History:  Diagnosis Date  . Allergy   . Anxiety   . Headache   . Knee fracture 2015  . Seasonal allergies     Past Surgical History:  Procedure Laterality Date  . I & D EXTREMITY Left 03/04/2014   Procedure: IRRIGATION AND DEBRIDEMENT KNEE ARTHROTOMY WITH REMOVAL OF BULLET FRAGMENTS;  Surgeon: Eldred Manges, MD;  Location: [facility] OR;  Service: Orthopedics;  Laterality: Left;  . KNEE SURGERY      Family History  Problem Relation Age of Onset  . Emphysema Paternal Grandmother     Social History   Tobacco Use  . Smoking status: Former Smoker    Packs/day: 0.50    Types: Cigarettes  . Smokeless tobacco: Never Used  Vaping Use  . Vaping Use: Never used  Substance Use Topics  . Alcohol use: Not Currently  . Drug use: No    Allergies:  Allergies  Allergen Reactions  . Orange Fruit [Citrus] Swelling    Swelling around the lips  . Penicillins Hives    Has patient had a PCN reaction causing immediate rash, facial/tongue/throat swelling, SOB or  lightheadedness with hypotension: No Has patient had a PCN reaction causing severe rash involving mucus membranes or skin necrosis: No Has patient had a PCN reaction that required hospitalization No Has patient had a PCN reaction occurring within the last 10 years: Yes If all of the above answers are "NO", then may proceed with Cephalosporin use.   . Tomato Swelling    Swelling around the lips    Medications Prior to Admission  Medication Sig Dispense Refill Last Dose  . ondansetron (ZOFRAN ODT) 8 MG disintegrating tablet 8mg  ODT q4 hours prn nausea 10 tablet 0     Review of Systems  Gastrointestinal: Positive for abdominal pain.  Genitourinary: Negative for vaginal bleeding.  All other systems reviewed and are negative.  Physical Exam   Blood pressure 111/66, pulse 74, temperature 98 F (36.7 C), temperature source Oral, resp. rate 20, last menstrual period 12/03/2019, SpO2 100 %.  Physical Exam Vitals and nursing note reviewed. Exam conducted with a chaperone present.  Constitutional:      General: She is not in acute distress.    Appearance: She is not ill-appearing.  Cardiovascular:     Rate and Rhythm: Normal rate.  Pulses: Normal pulses.  Pulmonary:     Effort: Pulmonary effort is normal.  Abdominal:     Comments: Gravid  Skin:    Capillary Refill: Capillary refill takes less than 2 seconds.  Neurological:     Mental Status: She is alert and oriented to person, place, and time.  Psychiatric:        Mood and Affect: Mood normal.        Behavior: Behavior normal.     MAU Course  Procedures  --Reactive tracing: baseline 135, mod var, + 15 x 15 accels, no decels --Toco: quiet --Cervix 2.5-3/60/-2  Patient Vitals for the past 24 hrs:  BP Temp Temp src Pulse Resp SpO2  07/30/20 1615 110/60 -- -- 72 -- --  07/30/20 1543 111/66 -- -- 74 -- --  07/30/20 1525 108/61 98 F (36.7 C) Oral 73 20 100 %   Assessment and Plan  --24 y.o. [id number] at [redacted]w[redacted]d   --Reactive NST --Cervix unchanged from previous --S/p BMZ 2 of 2 --Discharge home in stable condition  F/U: --Next appointment with [facility] is tomorrow 07/31/2020  Calvert Cantor, CNM 07/30/2020, 5:26 PM

## 2020-08-01 ENCOUNTER — Other Ambulatory Visit: Payer: Self-pay

## 2020-08-01 ENCOUNTER — Encounter (HOSPITAL_COMMUNITY)
Admission: RE | Admit: 2020-08-01 | Discharge: 2020-08-01 | Disposition: A | Discharge status: Home / Self Care | Payer: BLUE CROSS/BLUE SHIELD | Source: Ambulatory Visit | Attending: Obstetrics and Gynecology | Admitting: Obstetrics and Gynecology

## 2020-08-01 DIAGNOSIS — Z3A00 Weeks of gestation of pregnancy not specified: Secondary | ICD-10-CM | POA: Diagnosis not present

## 2020-08-01 DIAGNOSIS — D649 Anemia, unspecified: Secondary | ICD-10-CM | POA: Diagnosis not present

## 2020-08-01 DIAGNOSIS — O99019 Anemia complicating pregnancy, unspecified trimester: Secondary | ICD-10-CM | POA: Insufficient documentation

## 2020-08-01 MED ORDER — SODIUM CHLORIDE 0.9 % IV SOLN
300.0000 mg | INTRAVENOUS | Status: DC
  Administered 2020-08-01: 300 mg via INTRAVENOUS
  Filled 2020-08-01: qty 15

## 2020-08-01 NOTE — Discharge Instructions (Signed)

## 2020-08-08 ENCOUNTER — Inpatient Hospital Stay (HOSPITAL_COMMUNITY): Admission: RE | Admit: 2020-08-08 | Payer: BLUE CROSS/BLUE SHIELD | Source: Ambulatory Visit

## 2020-08-09 ENCOUNTER — Inpatient Hospital Stay (HOSPITAL_COMMUNITY)
Admission: AD | Admit: 2020-08-09 | Discharge: 2020-08-09 | Disposition: A | Discharge status: Home / Self Care | Payer: BLUE CROSS/BLUE SHIELD | Attending: Obstetrics and Gynecology | Admitting: Obstetrics and Gynecology

## 2020-08-09 ENCOUNTER — Other Ambulatory Visit: Payer: Self-pay

## 2020-08-09 ENCOUNTER — Encounter (HOSPITAL_COMMUNITY): Payer: Self-pay | Admitting: Obstetrics and Gynecology

## 2020-08-09 DIAGNOSIS — Z3A35 35 weeks gestation of pregnancy: Secondary | ICD-10-CM | POA: Diagnosis not present

## 2020-08-09 DIAGNOSIS — O4703 False labor before 37 completed weeks of gestation, third trimester: Secondary | ICD-10-CM | POA: Diagnosis not present

## 2020-08-09 DIAGNOSIS — Z3689 Encounter for other specified antenatal screening: Secondary | ICD-10-CM

## 2020-08-09 MED ORDER — LACTATED RINGERS IV BOLUS
1000.0000 mL | Freq: Once | INTRAVENOUS | Status: AC
  Administered 2020-08-09: 1000 mL via INTRAVENOUS

## 2020-08-09 NOTE — MAU Provider Note (Signed)
Event Date/Time   First Provider Initiated Contact with Patient 08/09/20 1503       S: [name] is a 24 y.o. [phone] at [redacted]w[redacted]d  who presents to MAU today complaining vaginal pressure that is intermittent in nature since around 1pm. She denies vaginal bleeding. She denies LOF. She reports normal fetal movement.    O: BP 117/68   Pulse 75   Temp 98.6 F (37 C) (Oral)   Resp 18   Ht 5\' 6"  (1.676 m)   Wt 71.2 kg   LMP 12/03/2019   SpO2 100%   BMI 25.34 kg/m  GENERAL: Well-developed, well-nourished female in no acute distress.  HEAD: Normocephalic, atraumatic.  CHEST: Normal effort of breathing, regular heart rate ABDOMEN: Soft, nontender, gravid  Cervical exam:  Dilation: 4.5 Effacement (%): 60 Cervical Position: Middle Station: -1 Presentation: Vertex Exam by:: [email or web address], CNM   Fetal Monitoring: Baseline: 145 Variability: Moderate Accelerations: Present Decelerations: None Contractions: Occasional with Irritability   A: SIUP at [redacted]w[redacted]d  Preterm Contractions NST Reactive  P: -Cervical exam with minimal change from exam in office. -Will start IV and give fluids.  -Nurse instructed to draw and hold labs  -Will monitor and reassess in 2 hours.  [redacted]w[redacted]d, CNM 08/09/2020 3:05 PM  Reassessment (5:12 PM)  -Patient reports improvement in pelvic pressure.  -Cervical exam remains the same. -Discussed discharge and to remain on bedrest as advised by Dr. 08/11/2020 at her last appt. -Patient verbalizes understanding. -Instructed to keep appt as scheduled for Tuesday. -Encouraged to call or return to MAU if symptoms worsen or with the onset of new symptoms. -Discharged to home in stable condition.  Thursday MSN, CNM Advanced Practice Provider, [facility]

## 2020-08-09 NOTE — MAU Note (Signed)
Thinks she is having contractions.  Started having pains about 45-90min ago.  Was getting hair done when they started. No bleeding or leaking. Was 4 cm when last checked. Reports +FM

## 2020-08-09 NOTE — MAU Note (Addendum)
Pt is a G2P1 at 35.5 weeks stating that she has been peeling "pains" for almost an hour now.  She is unsure of how often but thinks it is every 10 minutes or more.  Pt discribes it as a "balling up feeling" in her pelvis and is unsure if it is contractions or pressure.  Denies LOF or bleeding.  Pt states her last vaginal exam was 4cm on Wednesday.

## 2020-08-17 NOTE — L&D Delivery Note (Signed)
DELIVERY NOTE  Pt complete and at +2 station with urge to push. Epidural controlling pain. Pt pushed and delivered a viable female infant in ROA position. Anterior and posterior shoulders spontaneously delivered with next two pushes; body easily followed next. Infant placed on mothers abdomen and bulb suction of mouth and nose performed. Cord was then clamped and cut by FOB. Cord blood obtained, 3VC. Baby had a vigorous spontaneous cry noted. Placenta then delivered at 1752 intact. Fundal massage performed and pitocin per protocol. Fundus firm. The following lacerations were noted: none. Mother and baby stable. Counts correct. EBL 200  Infant time: 1750 Gender: female Placenta time: 1752 Apgars: 8/9 Weight: pending skin-to-skin

## 2020-08-19 ENCOUNTER — Encounter (HOSPITAL_COMMUNITY): Payer: Self-pay | Admitting: *Deleted

## 2020-08-19 ENCOUNTER — Inpatient Hospital Stay (HOSPITAL_COMMUNITY)
Admission: AD | Admit: 2020-08-19 | Discharge: 2020-08-19 | Disposition: A | Discharge status: Home / Self Care | Payer: BLUE CROSS/BLUE SHIELD | Attending: Obstetrics and Gynecology | Admitting: Obstetrics and Gynecology

## 2020-08-19 ENCOUNTER — Telehealth (HOSPITAL_COMMUNITY): Payer: Self-pay | Admitting: *Deleted

## 2020-08-19 ENCOUNTER — Inpatient Hospital Stay (HOSPITAL_COMMUNITY): Payer: BLUE CROSS/BLUE SHIELD

## 2020-08-19 ENCOUNTER — Encounter (HOSPITAL_COMMUNITY): Payer: Self-pay | Admitting: Obstetrics and Gynecology

## 2020-08-19 DIAGNOSIS — Z880 Allergy status to penicillin: Secondary | ICD-10-CM | POA: Insufficient documentation

## 2020-08-19 DIAGNOSIS — R059 Cough, unspecified: Secondary | ICD-10-CM | POA: Diagnosis not present

## 2020-08-19 DIAGNOSIS — Z87891 Personal history of nicotine dependence: Secondary | ICD-10-CM | POA: Insufficient documentation

## 2020-08-19 DIAGNOSIS — O98513 Other viral diseases complicating pregnancy, third trimester: Secondary | ICD-10-CM | POA: Insufficient documentation

## 2020-08-19 DIAGNOSIS — M791 Myalgia, unspecified site: Secondary | ICD-10-CM | POA: Diagnosis not present

## 2020-08-19 DIAGNOSIS — U071 COVID-19: Secondary | ICD-10-CM | POA: Diagnosis not present

## 2020-08-19 DIAGNOSIS — O99891 Other specified diseases and conditions complicating pregnancy: Secondary | ICD-10-CM

## 2020-08-19 DIAGNOSIS — Z3A37 37 weeks gestation of pregnancy: Secondary | ICD-10-CM | POA: Insufficient documentation

## 2020-08-19 DIAGNOSIS — Z8616 Personal history of COVID-19: Secondary | ICD-10-CM

## 2020-08-19 DIAGNOSIS — R519 Headache, unspecified: Secondary | ICD-10-CM | POA: Diagnosis present

## 2020-08-19 LAB — URINALYSIS, ROUTINE W REFLEX MICROSCOPIC
Bilirubin Urine: NEGATIVE
Glucose, UA: NEGATIVE mg/dL
Hgb urine dipstick: NEGATIVE
Ketones, ur: NEGATIVE mg/dL
Nitrite: NEGATIVE
Protein, ur: NEGATIVE mg/dL
Specific Gravity, Urine: 1.014 (ref 1.005–1.030)
pH: 6 (ref 5.0–8.0)

## 2020-08-19 LAB — CBC
HCT: 34.4 % — ABNORMAL LOW (ref 36.0–46.0)
Hemoglobin: 10.6 g/dL — ABNORMAL LOW (ref 12.0–15.0)
MCH: 23.3 pg — ABNORMAL LOW (ref 26.0–34.0)
MCHC: 30.8 g/dL (ref 30.0–36.0)
MCV: 75.8 fL — ABNORMAL LOW (ref 80.0–100.0)
Platelets: 340 10*3/uL (ref 150–400)
RBC: 4.54 MIL/uL (ref 3.87–5.11)
RDW: 14.5 % (ref 11.5–15.5)
WBC: 5.4 10*3/uL (ref 4.0–10.5)
nRBC: 0 % (ref 0.0–0.2)

## 2020-08-19 LAB — COMPREHENSIVE METABOLIC PANEL
ALT: 10 U/L (ref 0–44)
AST: 13 U/L — ABNORMAL LOW (ref 15–41)
Albumin: 2.7 g/dL — ABNORMAL LOW (ref 3.5–5.0)
Alkaline Phosphatase: 103 U/L (ref 38–126)
Anion gap: 10 (ref 5–15)
BUN: <5 mg/dL — ABNORMAL LOW (ref 6–20)
CO2: 22 mmol/L (ref 22–32)
Calcium: 8.8 mg/dL — ABNORMAL LOW (ref 8.9–10.3)
Chloride: 104 mmol/L (ref 98–111)
Creatinine, Ser: 0.6 mg/dL (ref 0.44–1.00)
GFR, Estimated: >60 mL/min (ref >60)
Glucose, Bld: 73 mg/dL (ref 70–99)
Potassium: 3.7 mmol/L (ref 3.5–5.1)
Sodium: 136 mmol/L (ref 135–145)
Total Bilirubin: 0.5 mg/dL (ref 0.3–1.2)
Total Protein: 6 g/dL — ABNORMAL LOW (ref 6.5–8.1)

## 2020-08-19 LAB — RESP PANEL BY RT-PCR (FLU A&B, COVID) ARPGX2
Influenza A by PCR: NEGATIVE
Influenza B by PCR: NEGATIVE
SARS Coronavirus 2 by RT PCR: POSITIVE — AB

## 2020-08-19 LAB — C-REACTIVE PROTEIN: CRP: 1.1 mg/dL — ABNORMAL HIGH (ref <1.0)

## 2020-08-19 MED ORDER — FLUTICASONE PROPIONATE 50 MCG/ACT NA SUSP: 1.0000 | Freq: Two times a day (BID) | NASAL | 2 refills | Status: DC

## 2020-08-19 MED ORDER — ACETAMINOPHEN 325 MG PO TABS
650.0000 mg | ORAL_TABLET | Freq: Once | ORAL | Status: AC
  Administered 2020-08-19: 650 mg via ORAL
  Filled 2020-08-19: qty 2

## 2020-08-19 NOTE — MAU Provider Note (Signed)
History     CSN: [id number]  Arrival date and time: 08/19/20 1341   Event Date/Time   First Provider Initiated Contact with Patient 08/19/20 1431      Chief Complaint  Patient presents with  . Generalized Body Aches  . Otalgia   HPI This is a 25yo [id number] at [redacted]w[redacted]d who presents with body aches, headache that started last night. Has occasional cough. Pregnancy complicated by previous episode of COVID in November, where she had similar complaints. No sick contacts. Felt warm. Took tylenol last night, which was helpful.  OB History    Gravida  4   Para  1   Term  1   Preterm      AB  2   Living  1     SAB      IAB  2   Ectopic      Multiple  0   Live Births  1           Past Medical History:  Diagnosis Date  . Allergy   . Anxiety   . Headache   . Knee fracture 2015  . Seasonal allergies     Past Surgical History:  Procedure Laterality Date  . I & D EXTREMITY Left 03/04/2014   Procedure: IRRIGATION AND DEBRIDEMENT KNEE ARTHROTOMY WITH REMOVAL OF BULLET FRAGMENTS;  Surgeon: Eldred Manges, MD;  Location: [facility] OR;  Service: Orthopedics;  Laterality: Left;  . KNEE SURGERY      Family History  Problem Relation Age of Onset  . Emphysema Paternal Grandmother     Social History   Tobacco Use  . Smoking status: Former Smoker    Packs/day: 0.50    Types: Cigarettes  . Smokeless tobacco: Never Used  Vaping Use  . Vaping Use: Never used  Substance Use Topics  . Alcohol use: Not Currently  . Drug use: No    Allergies:  Allergies  Allergen Reactions  . Orange Fruit [Citrus] Swelling    Swelling around the lips  . Penicillins Hives    Has patient had a PCN reaction causing immediate rash, facial/tongue/throat swelling, SOB or lightheadedness with hypotension: No Has patient had a PCN reaction causing severe rash involving mucus membranes or skin necrosis: No Has patient had a PCN reaction that required hospitalization No Has patient had a PCN  reaction occurring within the last 10 years: Yes If all of the above answers are "NO", then may proceed with Cephalosporin use.   . Tomato Swelling    Swelling around the lips    No medications prior to admission.    Review of Systems Physical Exam   Blood pressure 121/62, pulse 84, temperature 98.2 F (36.8 C), resp. rate 18, last menstrual period 12/03/2019, SpO2 100 %.  Physical Exam Vitals reviewed.  Constitutional:      Appearance: Normal appearance.  Cardiovascular:     Rate and Rhythm: Normal rate.     Pulses: Normal pulses.  Pulmonary:     Effort: Pulmonary effort is normal.     Breath sounds: Normal breath sounds.  Abdominal:     General: Abdomen is flat. There is no distension.     Palpations: Abdomen is soft. There is no mass.     Tenderness: There is no abdominal tenderness. There is no guarding or rebound.     Hernia: No hernia is present.  Skin:    General: Skin is warm and dry.     Capillary Refill: Capillary refill takes less than  2 seconds.  Neurological:     General: No focal deficit present.     Mental Status: She is alert.  Psychiatric:        Mood and Affect: Mood normal.        Behavior: Behavior normal.        Thought Content: Thought content normal.        Judgment: Judgment normal.    Results for orders placed or performed during the hospital encounter of 08/19/20 (from the past 24 hour(s))  CBC     Status: Abnormal   Collection Time: 08/19/20  2:31 PM  Result Value Ref Range   WBC 5.4 4.0 - 10.5 K/uL   RBC 4.54 3.87 - 5.11 MIL/uL   Hemoglobin 10.6 (L) 12.0 - 15.0 g/dL   HCT 34.4 (L) 36.0 - 46.0 %   MCV 75.8 (L) 80.0 - 100.0 fL   MCH 23.3 (L) 26.0 - 34.0 pg   MCHC 30.8 30.0 - 36.0 g/dL   RDW 14.5 11.5 - 15.5 %   Platelets 340 150 - 400 K/uL   nRBC 0.0 0.0 - 0.2 %  Comprehensive metabolic panel     Status: Abnormal   Collection Time: 08/19/20  2:31 PM  Result Value Ref Range   Sodium 136 135 - 145 mmol/L   Potassium 3.7 3.5 - 5.1  mmol/L   Chloride 104 98 - 111 mmol/L   CO2 22 22 - 32 mmol/L   Glucose, Bld 73 70 - 99 mg/dL   BUN <5 (L) 6 - 20 mg/dL   Creatinine, Ser 0.60 0.44 - 1.00 mg/dL   Calcium 8.8 (L) 8.9 - 10.3 mg/dL   Total Protein 6.0 (L) 6.5 - 8.1 g/dL   Albumin 2.7 (L) 3.5 - 5.0 g/dL   AST 13 (L) 15 - 41 U/L   ALT 10 0 - 44 U/L   Alkaline Phosphatase 103 38 - 126 U/L   Total Bilirubin 0.5 0.3 - 1.2 mg/dL   GFR, Estimated >60 >60 mL/min   Anion gap 10 5 - 15  C-reactive protein     Status: Abnormal   Collection Time: 08/19/20  2:31 PM  Result Value Ref Range   CRP 1.1 (H) <1.0 mg/dL  Urinalysis, Routine w reflex microscopic Urine, Clean Catch     Status: Abnormal   Collection Time: 08/19/20  2:34 PM  Result Value Ref Range   Color, Urine YELLOW YELLOW   APPearance HAZY (A) CLEAR   Specific Gravity, Urine 1.014 1.005 - 1.030   pH 6.0 5.0 - 8.0   Glucose, UA NEGATIVE NEGATIVE mg/dL   Hgb urine dipstick NEGATIVE NEGATIVE   Bilirubin Urine NEGATIVE NEGATIVE   Ketones, ur NEGATIVE NEGATIVE mg/dL   Protein, ur NEGATIVE NEGATIVE mg/dL   Nitrite NEGATIVE NEGATIVE   Leukocytes,Ua LARGE (A) NEGATIVE   RBC / HPF 0-5 0 - 5 RBC/hpf   WBC, UA 11-20 0 - 5 WBC/hpf   Bacteria, UA RARE (A) NONE SEEN   Squamous Epithelial / LPF 6-10 0 - 5   Mucus PRESENT   Resp Panel by RT-PCR (Flu A&B, Covid) Nasopharyngeal Swab     Status: Abnormal   Collection Time: 08/19/20  2:46 PM   Specimen: Nasopharyngeal Swab; Nasopharyngeal(NP) swabs in vial transport medium  Result Value Ref Range   SARS Coronavirus 2 by RT PCR POSITIVE (A) NEGATIVE   Influenza A by PCR NEGATIVE NEGATIVE   Influenza B by PCR NEGATIVE NEGATIVE   DG Chest 1 View  Result Date: 08/19/2020 CLINICAL DATA:  Headache, subjective fever and body aches. EXAM: CHEST  1 VIEW COMPARISON:  None. FINDINGS: Lungs clear. Heart size normal. No pneumothorax or pleural fluid. No bony abnormality. IMPRESSION: Normal chest. Electronically Signed   By: Drusilla Kanner  M.D.   On: 08/19/2020 16:12     MAU Course  Procedures  MDM  Assessment and Plan  1. Cough - DG Chest 1 View; Standing - DG Chest 1 View  2. [redacted] weeks gestation of pregnancy  3. Myalgia  4. History of COVID-19  COVID test is positive, although it has been less than 90 days since her COVID diagnosis - this could be positive from that. CRP slightly elevated - could represent new infection, although early.  Recommended self-quarantine. Recent study shows flonase with mild symptoms helps to prevent severe disease. Recommended flonase, vitamin C, zinc, proning. Return precautions given.  Levie Heritage 08/19/2020, 2:31 PM

## 2020-08-19 NOTE — MAU Note (Addendum)
Pt stated last night she felt feverish, (hot). Took tylenol. C/O today of headache, generalized body ache and left ear pain. Denies any ctx and good fetal movement reported.c/o occasional cough as well.

## 2020-08-19 NOTE — Telephone Encounter (Signed)
Preadmission screen  

## 2020-08-29 ENCOUNTER — Inpatient Hospital Stay (HOSPITAL_COMMUNITY)
Admission: AD | Admit: 2020-08-29 | Payer: BLUE CROSS/BLUE SHIELD | Source: Home / Self Care | Admitting: Obstetrics and Gynecology

## 2020-08-29 ENCOUNTER — Inpatient Hospital Stay (HOSPITAL_COMMUNITY): Payer: BLUE CROSS/BLUE SHIELD

## 2020-08-30 ENCOUNTER — Other Ambulatory Visit: Payer: Self-pay | Admitting: Obstetrics and Gynecology

## 2020-09-01 ENCOUNTER — Inpatient Hospital Stay (HOSPITAL_COMMUNITY): Payer: BLUE CROSS/BLUE SHIELD | Admitting: Anesthesiology

## 2020-09-01 ENCOUNTER — Encounter (HOSPITAL_COMMUNITY): Payer: Self-pay | Admitting: Obstetrics and Gynecology

## 2020-09-01 ENCOUNTER — Inpatient Hospital Stay (HOSPITAL_COMMUNITY)
Admission: AD | Admit: 2020-09-01 | Discharge: 2020-09-02 | DRG: 807 | Disposition: A | Discharge status: Home / Self Care | Payer: BLUE CROSS/BLUE SHIELD | Attending: Obstetrics and Gynecology | Admitting: Obstetrics and Gynecology

## 2020-09-01 ENCOUNTER — Inpatient Hospital Stay (HOSPITAL_COMMUNITY): Payer: BLUE CROSS/BLUE SHIELD

## 2020-09-01 DIAGNOSIS — O99824 Streptococcus B carrier state complicating childbirth: Principal | ICD-10-CM | POA: Diagnosis present

## 2020-09-01 DIAGNOSIS — Z3A39 39 weeks gestation of pregnancy: Secondary | ICD-10-CM | POA: Diagnosis not present

## 2020-09-01 DIAGNOSIS — O9902 Anemia complicating childbirth: Secondary | ICD-10-CM | POA: Diagnosis present

## 2020-09-01 DIAGNOSIS — D649 Anemia, unspecified: Secondary | ICD-10-CM | POA: Diagnosis present

## 2020-09-01 DIAGNOSIS — Z880 Allergy status to penicillin: Secondary | ICD-10-CM | POA: Diagnosis not present

## 2020-09-01 DIAGNOSIS — Z8616 Personal history of COVID-19: Secondary | ICD-10-CM

## 2020-09-01 DIAGNOSIS — Z87891 Personal history of nicotine dependence: Secondary | ICD-10-CM

## 2020-09-01 DIAGNOSIS — O26893 Other specified pregnancy related conditions, third trimester: Secondary | ICD-10-CM | POA: Diagnosis present

## 2020-09-01 LAB — CBC
HCT: 37.1 % (ref 36.0–46.0)
Hemoglobin: 11.3 g/dL — ABNORMAL LOW (ref 12.0–15.0)
MCH: 23.4 pg — ABNORMAL LOW (ref 26.0–34.0)
MCHC: 30.5 g/dL (ref 30.0–36.0)
MCV: 76.8 fL — ABNORMAL LOW (ref 80.0–100.0)
Platelets: 354 10*3/uL (ref 150–400)
RBC: 4.83 MIL/uL (ref 3.87–5.11)
RDW: 14.1 % (ref 11.5–15.5)
WBC: 8.1 10*3/uL (ref 4.0–10.5)
nRBC: 0 % (ref 0.0–0.2)

## 2020-09-01 LAB — RPR: RPR Ser Ql: NONREACTIVE

## 2020-09-01 LAB — TYPE AND SCREEN
ABO/RH(D): O POS
Antibody Screen: NEGATIVE

## 2020-09-01 MED ORDER — ZOLPIDEM TARTRATE 5 MG PO TABS: 5.0000 mg | ORAL_TABLET | Freq: Every evening | ORAL | Status: DC | PRN

## 2020-09-01 MED ORDER — ACETAMINOPHEN 325 MG PO TABS
650.0000 mg | ORAL_TABLET | ORAL | Status: DC | PRN
  Administered 2020-09-02: 650 mg via ORAL
  Filled 2020-09-01: qty 2

## 2020-09-01 MED ORDER — LIDOCAINE HCL (PF) 1 % IJ SOLN
INTRAMUSCULAR | Status: DC | PRN
  Administered 2020-09-01: 8 mL via EPIDURAL

## 2020-09-01 MED ORDER — FENTANYL-BUPIVACAINE-NACL 0.5-0.125-0.9 MG/250ML-% EP SOLN
EPIDURAL | Status: AC
  Filled 2020-09-01: qty 250

## 2020-09-01 MED ORDER — ACETAMINOPHEN 325 MG PO TABS
650.0000 mg | ORAL_TABLET | ORAL | Status: DC | PRN
  Administered 2020-09-01: 650 mg via ORAL
  Filled 2020-09-01: qty 2

## 2020-09-01 MED ORDER — TERBUTALINE SULFATE 1 MG/ML IJ SOLN: 0.2500 mg | Freq: Once | INTRAMUSCULAR | Status: DC | PRN

## 2020-09-01 MED ORDER — OXYCODONE-ACETAMINOPHEN 5-325 MG PO TABS: 1.0000 | ORAL_TABLET | ORAL | Status: DC | PRN

## 2020-09-01 MED ORDER — COCONUT OIL OIL: 1.0000 "application " | TOPICAL_OIL | Status: DC | PRN

## 2020-09-01 MED ORDER — LIDOCAINE HCL (PF) 1 % IJ SOLN: 30.0000 mL | INTRAMUSCULAR | Status: DC | PRN

## 2020-09-01 MED ORDER — SODIUM CHLORIDE 0.9 % IV SOLN: 300.0000 mg | INTRAVENOUS | Status: DC

## 2020-09-01 MED ORDER — DIPHENHYDRAMINE HCL 50 MG/ML IJ SOLN
12.5000 mg | INTRAMUSCULAR | Status: DC | PRN
Start: 2020-09-01 — End: 2020-09-01

## 2020-09-01 MED ORDER — PHENYLEPHRINE 40 MCG/ML (10ML) SYRINGE FOR IV PUSH (FOR BLOOD PRESSURE SUPPORT): 80.0000 ug | PREFILLED_SYRINGE | INTRAVENOUS | Status: DC | PRN

## 2020-09-01 MED ORDER — IBUPROFEN 600 MG PO TABS
600.0000 mg | ORAL_TABLET | Freq: Four times a day (QID) | ORAL | Status: DC
  Administered 2020-09-01 – 2020-09-02 (×4): 600 mg via ORAL
  Filled 2020-09-01 (×4): qty 1

## 2020-09-01 MED ORDER — BENZOCAINE-MENTHOL 20-0.5 % EX AERO
1.0000 "application " | INHALATION_SPRAY | CUTANEOUS | Status: DC | PRN
  Administered 2020-09-02: 1 via TOPICAL
  Filled 2020-09-01: qty 56

## 2020-09-01 MED ORDER — SOD CITRATE-CITRIC ACID 500-334 MG/5ML PO SOLN: 30.0000 mL | ORAL | Status: DC | PRN

## 2020-09-01 MED ORDER — FENTANYL-BUPIVACAINE-NACL 0.5-0.125-0.9 MG/250ML-% EP SOLN: 12.0000 mL/h | EPIDURAL | Status: DC | PRN

## 2020-09-01 MED ORDER — OXYTOCIN BOLUS FROM INFUSION
333.0000 mL | Freq: Once | INTRAVENOUS | Status: AC
  Administered 2020-09-01: 333 mL via INTRAVENOUS

## 2020-09-01 MED ORDER — DIPHENHYDRAMINE HCL 50 MG/ML IJ SOLN: 12.5000 mg | INTRAMUSCULAR | Status: DC | PRN

## 2020-09-01 MED ORDER — ONDANSETRON HCL 4 MG/2ML IJ SOLN: 4.0000 mg | INTRAMUSCULAR | Status: DC | PRN

## 2020-09-01 MED ORDER — DIPHENHYDRAMINE HCL 25 MG PO CAPS
25.0000 mg | ORAL_CAPSULE | Freq: Four times a day (QID) | ORAL | Status: DC | PRN
Start: 2020-09-01 — End: 2020-09-03

## 2020-09-01 MED ORDER — FLEET ENEMA 7-19 GM/118ML RE ENEM: 1.0000 | ENEMA | RECTAL | Status: DC | PRN

## 2020-09-01 MED ORDER — WITCH HAZEL-GLYCERIN EX PADS: 1.0000 "application " | MEDICATED_PAD | CUTANEOUS | Status: DC | PRN

## 2020-09-01 MED ORDER — SODIUM CHLORIDE (PF) 0.9 % IJ SOLN
INTRAMUSCULAR | Status: DC | PRN
  Administered 2020-09-01: 12 mL/h via EPIDURAL

## 2020-09-01 MED ORDER — OXYTOCIN-SODIUM CHLORIDE 30-0.9 UT/500ML-% IV SOLN
2.5000 [IU]/h | INTRAVENOUS | Status: DC
  Filled 2020-09-01: qty 500

## 2020-09-01 MED ORDER — OXYTOCIN-SODIUM CHLORIDE 30-0.9 UT/500ML-% IV SOLN
1.0000 m[IU]/min | INTRAVENOUS | Status: DC
  Administered 2020-09-01: 2 m[IU]/min via INTRAVENOUS

## 2020-09-01 MED ORDER — LACTATED RINGERS IV SOLN: 500.0000 mL | Freq: Once | INTRAVENOUS | Status: DC

## 2020-09-01 MED ORDER — SIMETHICONE 80 MG PO CHEW: 80.0000 mg | CHEWABLE_TABLET | ORAL | Status: DC | PRN

## 2020-09-01 MED ORDER — VANCOMYCIN HCL IN DEXTROSE 1-5 GM/200ML-% IV SOLN
1000.0000 mg | Freq: Two times a day (BID) | INTRAVENOUS | Status: DC
  Administered 2020-09-01: 1000 mg via INTRAVENOUS
  Filled 2020-09-01: qty 200

## 2020-09-01 MED ORDER — LACTATED RINGERS IV SOLN: INTRAVENOUS | Status: DC

## 2020-09-01 MED ORDER — DIBUCAINE (PERIANAL) 1 % EX OINT: 1.0000 "application " | TOPICAL_OINTMENT | CUTANEOUS | Status: DC | PRN

## 2020-09-01 MED ORDER — LACTATED RINGERS IV SOLN
500.0000 mL | INTRAVENOUS | Status: DC | PRN
  Administered 2020-09-01: 1000 mL via INTRAVENOUS

## 2020-09-01 MED ORDER — BUTORPHANOL TARTRATE 1 MG/ML IJ SOLN: 1.0000 mg | INTRAMUSCULAR | Status: DC | PRN

## 2020-09-01 MED ORDER — EPHEDRINE 5 MG/ML INJ: 10.0000 mg | INTRAVENOUS | Status: DC | PRN

## 2020-09-01 MED ORDER — PRENATAL MULTIVITAMIN CH
1.0000 | ORAL_TABLET | Freq: Every day | ORAL | Status: DC
  Administered 2020-09-02: 1 via ORAL
  Filled 2020-09-01: qty 1

## 2020-09-01 MED ORDER — SENNOSIDES-DOCUSATE SODIUM 8.6-50 MG PO TABS
2.0000 | ORAL_TABLET | Freq: Every day | ORAL | Status: DC
  Administered 2020-09-02: 2 via ORAL
  Filled 2020-09-01: qty 2

## 2020-09-01 MED ORDER — ONDANSETRON HCL 4 MG/2ML IJ SOLN: 4.0000 mg | Freq: Four times a day (QID) | INTRAMUSCULAR | Status: DC | PRN

## 2020-09-01 MED ORDER — TETANUS-DIPHTH-ACELL PERTUSSIS 5-2.5-18.5 LF-MCG/0.5 IM SUSY: 0.5000 mL | PREFILLED_SYRINGE | Freq: Once | INTRAMUSCULAR | Status: DC

## 2020-09-01 MED ORDER — ONDANSETRON HCL 4 MG PO TABS: 4.0000 mg | ORAL_TABLET | ORAL | Status: DC | PRN

## 2020-09-01 MED ORDER — OXYCODONE-ACETAMINOPHEN 5-325 MG PO TABS: 2.0000 | ORAL_TABLET | ORAL | Status: DC | PRN

## 2020-09-01 NOTE — Progress Notes (Signed)
First push, small crown noted

## 2020-09-01 NOTE — Anesthesia Procedure Notes (Signed)
Epidural Patient location during procedure: OB Start time: 09/01/2020 11:55 AM End time: 09/01/2020 12:00 PM  Staffing Anesthesiologist: Bethena Midget, MD  Preanesthetic Checklist Completed: patient identified, IV checked, site marked, risks and benefits discussed, surgical consent, monitors and equipment checked, pre-op evaluation and timeout performed  Epidural Patient position: sitting Prep: DuraPrep and site prepped and draped Patient monitoring: continuous pulse ox and blood pressure Approach: midline Location: L3-L4 Injection technique: LOR air  Needle:  Needle type: Tuohy  Needle gauge: 17 G Needle length: 9 cm and 9 Needle insertion depth: 6 cm Catheter type: closed end flexible Catheter size: 19 Gauge Catheter at skin depth: 11 cm Test dose: negative  Assessment Events: blood not aspirated, injection not painful, no injection resistance, no paresthesia and negative IV test

## 2020-09-01 NOTE — H&P (Signed)
Dawn Proctor is a 25 y.o. female presenting for scheduled IOL. _FM, denies VB, LOF, CTX.   Patient positive COVID both in Nov also 08/19/20. GBS pos with PCN allergy and clinda resistant. Anemia s/p Feraheme x1.   Found to be 3cm @ 34wks, received BMTZ course 12/13-14 OB History    Gravida  4   Para  1   Term  1   Preterm      AB  2   Living  1     SAB      IAB  2   Ectopic      Multiple  0   Live Births  1          Past Medical History:  Diagnosis Date  . Allergy   . Anxiety   . Headache   . Knee fracture 2015  . Seasonal allergies    Past Surgical History:  Procedure Laterality Date  . I & D EXTREMITY Left 03/04/2014   Procedure: IRRIGATION AND DEBRIDEMENT KNEE ARTHROTOMY WITH REMOVAL OF BULLET FRAGMENTS;  Surgeon: Eldred Manges, MD;  Location: [facility] OR;  Service: Orthopedics;  Laterality: Left;  . KNEE SURGERY     Family History: family history includes Dementia in her maternal grandmother; Emphysema in her paternal grandmother. Social History:  reports that she has quit smoking. Her smoking use included cigarettes. She smoked 0.50 packs per day. She has never used smokeless tobacco. She reports previous alcohol use. She reports that she does not use drugs.     Maternal Diabetes: No 1hr 91 Genetic Screening: Declined Maternal Ultrasounds/Referrals: Normal Fetal Ultrasounds or other Referrals:  None Maternal Substance Abuse:  No Significant Maternal Medications:  None Significant Maternal Lab Results:  Group B Strep positive Other Comments:  None  Review of Systems  Constitutional: Negative for chills and fever.  Respiratory: Negative for shortness of breath.   Cardiovascular: Negative for chest pain, palpitations and leg swelling.  Gastrointestinal: Negative for abdominal pain and vomiting.  Neurological: Negative for dizziness, weakness and headaches.  Psychiatric/Behavioral: Negative for suicidal ideas.   Maternal Medical History:   Contractions: Frequency: rare.    Fetal activity: Perceived fetal activity is normal.   Last perceived fetal movement was within the past hour.    Prenatal complications: No bleeding.       Last menstrual period 12/03/2019. Exam Physical Exam Constitutional:      General: She is not in acute distress.    Appearance: She is well-developed and well-nourished.  HENT:     Head: Normocephalic and atraumatic.  Eyes:     Pupils: Pupils are equal, round, and reactive to light.  Cardiovascular:     Rate and Rhythm: Normal rate and regular rhythm.     Heart sounds: No murmur heard. No gallop.   Abdominal:     Tenderness: There is no abdominal tenderness. There is no guarding or rebound.     Comments: Gravid  Genitourinary:    Vagina: Normal.     Uterus: Normal.   Musculoskeletal:        General: Normal range of motion.     Cervical back: Normal range of motion and neck supple.  Skin:    General: Skin is warm and dry.  Neurological:     Mental Status: She is alert and oriented to person, place, and time.     Prenatal labs: ABO, Rh: --/--/PENDING (01/16 1005) Antibody: PENDING (01/16 1005) Rubella: Immune (06/28 0000) RPR: NON REACTIVE (05/26 1324)  HBsAg:  Negative (06/28 0000)  HIV: Non Reactive (05/26 1324)  GBS:   POS - clinda resistent   Assessment/Plan: This is a 25yo  [id number] @ 39 0/7 by LMP c/w 10wk scan admitted for IOL for favorable cervix at term. Anemia, GBS pos, recent COVID pos (asx)  Admit to L&D< desires epidural. GBS pos, requires VANC. Pending labs, place epidural then titrate pitocin per procotol  CE 5/80/-2, posterior. TOCO q45m, pt does nto feel  Carlisle Cater 09/01/2020, 10:26 AM

## 2020-09-01 NOTE — Progress Notes (Signed)
Labor Note  S: comfortable s/p epidural  O: BP 116/75   Pulse 71   Temp 98.2 F (36.8 C) (Oral)   Resp 18   LMP 12/03/2019  CE: AROM with thick meconium, 7/90/-1 FHR: Baseline 135, +accels, -decels, min to mod variability TOCO 2-74m, pitocin at 21mU/min  A/P: This is a 25 y.o. [id number] at [redacted]w[redacted]d  admitted for IOL. GBS pos on vanc. H/o anemia FWB: Cat 1 MWB:s/p epidural Labor course: s/p AROM 1600, meocnium  Anticipate SVD

## 2020-09-01 NOTE — Progress Notes (Signed)
Due to advanced dilation, pt requesting epidural before ROM.

## 2020-09-01 NOTE — Anesthesia Preprocedure Evaluation (Signed)
Anesthesia Evaluation  Patient identified by MRN, date of birth, ID band Patient awake    Reviewed: Allergy & Precautions, NPO status , Patient's Chart, lab work & pertinent test results  Airway Mallampati: I  TM Distance: >3 FB Neck ROM: Full    Dental no notable dental hx. (+) Teeth Intact, Dental Advisory Given   Pulmonary former smoker,    Pulmonary exam normal breath sounds clear to auscultation       Cardiovascular negative cardio ROS Normal cardiovascular exam Rhythm:Regular Rate:Normal     Neuro/Psych  Headaches, PSYCHIATRIC DISORDERS Anxiety Depression    GI/Hepatic negative GI ROS, Neg liver ROS,   Endo/Other  negative endocrine ROS  Renal/GU negative Renal ROS     Musculoskeletal negative musculoskeletal ROS (+)   Abdominal   Peds  Hematology negative hematology ROS (+)   Anesthesia Other Findings   Reproductive/Obstetrics (+) Pregnancy                             Anesthesia Physical  Anesthesia Plan  ASA: II  Anesthesia Plan: Epidural   Post-op Pain Management:    Induction:   PONV Risk Score and Plan:   Airway Management Planned:   Additional Equipment:   Intra-op Plan:   Post-operative Plan:   Informed Consent: I have reviewed the patients History and Physical, chart, labs and discussed the procedure including the risks, benefits and alternatives for the proposed anesthesia with the patient or authorized representative who has indicated his/her understanding and acceptance.       Plan Discussed with:   Anesthesia Plan Comments:         Anesthesia Quick Evaluation

## 2020-09-01 NOTE — Anesthesia Postprocedure Evaluation (Signed)
Anesthesia Post Note  Patient: Dawn Proctor  Procedure(s) Performed: AN AD HOC LABOR EPIDURAL     Patient location during evaluation: Mother Baby Anesthesia Type: Epidural Level of consciousness: awake and alert Pain management: pain level controlled Vital Signs Assessment: post-procedure vital signs reviewed and stable Respiratory status: spontaneous breathing, nonlabored ventilation and respiratory function stable Cardiovascular status: stable Postop Assessment: no headache, no backache and epidural receding Anesthetic complications: no   No complications documented.  Last Vitals:  Vitals:   09/01/20 1302 09/01/20 2036  BP: 111/86 115/63  Pulse: 87 66  Resp:  17  Temp:  36.8 C  SpO2:  100%    Last Pain:  Vitals:   09/01/20 2036  TempSrc: Oral  PainSc:    Pain Goal:                Epidural/Spinal Function Patient able to flex knees: Yes (09/01/20 2015), Patient able to lift hips off bed: Yes (09/01/20 2015), Back pain beyond tenderness at insertion site: No (09/01/20 2015)  Brindley Madarang

## 2020-09-01 NOTE — Progress Notes (Signed)
SVD babu girl skin to skin with mother

## 2020-09-02 LAB — CBC
HCT: 30.9 % — ABNORMAL LOW (ref 36.0–46.0)
Hemoglobin: 9.8 g/dL — ABNORMAL LOW (ref 12.0–15.0)
MCH: 24 pg — ABNORMAL LOW (ref 26.0–34.0)
MCHC: 31.7 g/dL (ref 30.0–36.0)
MCV: 75.7 fL — ABNORMAL LOW (ref 80.0–100.0)
Platelets: 303 10*3/uL (ref 150–400)
RBC: 4.08 MIL/uL (ref 3.87–5.11)
RDW: 14.1 % (ref 11.5–15.5)
WBC: 10.8 10*3/uL — ABNORMAL HIGH (ref 4.0–10.5)
nRBC: 0 % (ref 0.0–0.2)

## 2020-09-02 MED ORDER — IBUPROFEN 600 MG PO TABS: 600.0000 mg | ORAL_TABLET | Freq: Four times a day (QID) | ORAL | 0 refills | Status: DC

## 2020-09-02 MED ORDER — ACETAMINOPHEN 325 MG PO TABS: 650.0000 mg | ORAL_TABLET | ORAL | 0 refills | Status: AC | PRN

## 2020-09-02 NOTE — Progress Notes (Signed)
Post Partum Day 1  Subjective: no complaints, voiding and tolerating PO   Bottlefeeding baby and doing well  Objective: Blood pressure 106/69, pulse 63, temperature 98 F (36.7 C), temperature source Oral, resp. rate 18, last menstrual period 12/03/2019, SpO2 99 %, unknown if currently breastfeeding.  Physical Exam:  General: alert and cooperative Lochia: appropriate Uterine Fundus: firm  Recent Labs    09/01/20 1011 09/02/20 0415  HGB 11.3* 9.8*  HCT 37.1 30.9*    Assessment/Plan: Plan for discharge tomorrow, baby will not be 24 hours until late PM   LOS: 1 day   Dawn Proctor 09/02/2020, 9:28 AM

## 2020-09-02 NOTE — Progress Notes (Addendum)
CSW called into MOB's room to gather information. Assessment to follow.    Radha Coggins S. Georgene Kopper, MSW, LCSW [facility] [phone]   

## 2020-09-02 NOTE — Discharge Summary (Signed)
Postpartum Discharge Summary       Patient Name: Dawn Proctor DOB: 05-26-1996 MRN: [id number]  Date of admission: 09/01/2020 Delivery date:09/01/2020  Delivering provider: Carlisle Cater  Date of discharge: 09/02/2020  Admitting diagnosis: [redacted] weeks gestation of pregnancy [Z3A.39] Intrauterine pregnancy: [redacted]w[redacted]d     Secondary diagnosis:  Active Problems:   [redacted] weeks gestation of pregnancy  Additional problems: GBS positive, clindamycin resistant                                               Anemia    Discharge diagnosis: Term Pregnancy Delivered                                              Post partum procedures:none Augmentation: AROM and Pitocin Complications: None  Hospital course: Induction of Labor With Vaginal Delivery   25 y.o. yo [id number] at [redacted]w[redacted]d was admitted to the hospital 09/01/2020 for induction of labor.  Indication for induction: Favorable cervix at term.  Patient had an uncomplicated labor course as follows: Membrane Rupture Time/Date: 4:00 PM ,09/01/2020   Delivery Method:Vaginal, Spontaneous  Episiotomy: None  Lacerations:  None  Details of delivery can be found in separate delivery note.  Patient had a routine postpartum course. Patient is discharged home 09/02/20.  Newborn Data: Birth date:09/01/2020  Birth time:5:50 PM  Gender:Female  Living status:Living  Apgars:9 ,9  Weight:3014 g   Magnesium Sulfate received: No BMZ received: No Rhophylac:No   Physical exam  Vitals:   09/01/20 2143 09/02/20 0132 09/02/20 0543 09/02/20 1538  BP: 104/67 97/62 106/69 106/64  Pulse: 70 64 63 80  Resp: 18 17 18 17   Temp: 98.2 F (36.8 C) 98 F (36.7 C) 98 F (36.7 C) 98.2 F (36.8 C)  TempSrc: Oral Oral Oral Oral  SpO2: 100% 100% 99%    General: alert and cooperative Lochia: appropriate Uterine Fundus: firm  Labs: Lab Results  Component Value Date   WBC 10.8 (H) 09/02/2020   HGB 9.8 (L) 09/02/2020   HCT 30.9 (L) 09/02/2020   MCV 75.7 (L)  09/02/2020   PLT 303 09/02/2020   CMP Latest Ref Rng & Units 08/19/2020  Glucose 70 - 99 mg/dL 73  BUN 6 - 20 mg/dL 10/17/2020)  Creatinine <6(A - 1.00 mg/dL 6.30  Sodium 1.60 - 109 mmol/L 136  Potassium 3.5 - 5.1 mmol/L 3.7  Chloride 98 - 111 mmol/L 104  CO2 22 - 32 mmol/L 22  Calcium 8.9 - 10.3 mg/dL 323)  Total Protein 6.5 - 8.1 g/dL 6.0(L)  Total Bilirubin 0.3 - 1.2 mg/dL 0.5  Alkaline Phos 38 - 126 U/L 103  AST 15 - 41 U/L 13(L)  ALT 0 - 44 U/L 10   Edinburgh Score: Edinburgh Postnatal Depression Scale Screening Tool 09/01/2020  I have been able to laugh and see the funny side of things. 0  I have looked forward with enjoyment to things. 0  I have blamed myself unnecessarily when things went wrong. 2  I have been anxious or worried for no good reason. 2  I have felt scared or panicky for no good reason. 2  Things have been getting on top of me. 1  I have been so  unhappy that I have had difficulty sleeping. 0  I have felt sad or miserable. 1  I have been so unhappy that I have been crying. 1  The thought of harming myself has occurred to me. 0  Edinburgh Postnatal Depression Scale Total 9     After visit meds:  Allergies as of 09/02/2020      Reactions   Orange Fruit [citrus] Swelling   Swelling around the lips   Penicillins Hives   Has patient had a PCN reaction causing immediate rash, facial/tongue/throat swelling, SOB or lightheadedness with hypotension: No Has patient had a PCN reaction causing severe rash involving mucus membranes or skin necrosis: No Has patient had a PCN reaction that required hospitalization No Has patient had a PCN reaction occurring within the last 10 years: Yes If all of the above answers are "NO", then may proceed with Cephalosporin use.   Tomato Swelling   Swelling around the lips      Medication List    TAKE these medications   acetaminophen 325 MG tablet Commonly known as: Tylenol Take 2 tablets (650 mg total) by mouth every 4 (four)  hours as needed (for pain scale < 4).   fluticasone 50 MCG/ACT nasal spray Commonly known as: Flonase Place 1 spray into both nostrils in the morning and at bedtime.   ibuprofen 600 MG tablet Commonly known as: ADVIL Take 1 tablet (600 mg total) by mouth every 6 (six) hours. Start taking on: September 03, 2020        Discharge home in stable condition Infant Feeding: Bottle Infant Disposition:home with mother Discharge instruction: per After Visit Summary and Postpartum booklet. Activity: Advance as tolerated. Pelvic rest for 6 weeks.  Diet: routine diet Future Appointments:No future appointments. Follow up Visit:  Follow-up Information    Shivaji, Valerie Roys, MD. Schedule an appointment as soon as possible for a visit in 6 week(s).   Specialty: Obstetrics and Gynecology Why: postpartum Contact information: [address] [place] [address] [place] [address] [phone]                Please schedule this patient for a In person postpartum visit in 6 weeks with the following provider: MD.  Delivery mode:  Vaginal, Spontaneous  Anticipated Birth Control:  Unsure   09/02/2020 Oliver Pila, MD
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
## 2021-01-24 IMAGING — US US OB < 14 WEEKS - US OB TV
1 series · 13 of 28 positions shown · non-contrast
Comparison: None available.

CLINICAL DATA: Initial evaluation for acute abdominal pain,
positive pregnancy test.

EXAM:
OBSTETRIC <14 WK US AND TRANSVAGINAL OB US
TECHNIQUE: Both transabdominal and transvaginal ultrasound examinations were
performed for complete evaluation of the gestation as well as the
maternal uterus, adnexal regions, and pelvic cul-de-sac.
Transvaginal technique was performed to assess early pregnancy.

[Series 1: us ob < 14 weeks - us ob tv · 31 acquisitions, 13 frames shown]
[im 2/31]
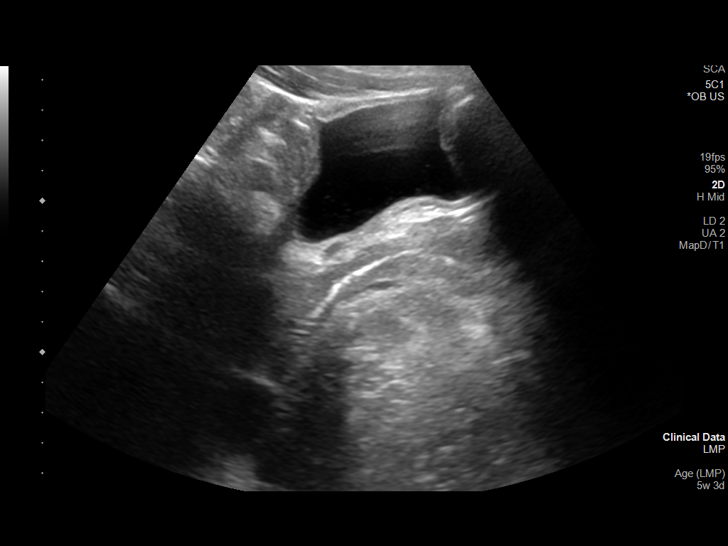
[im 4/31]
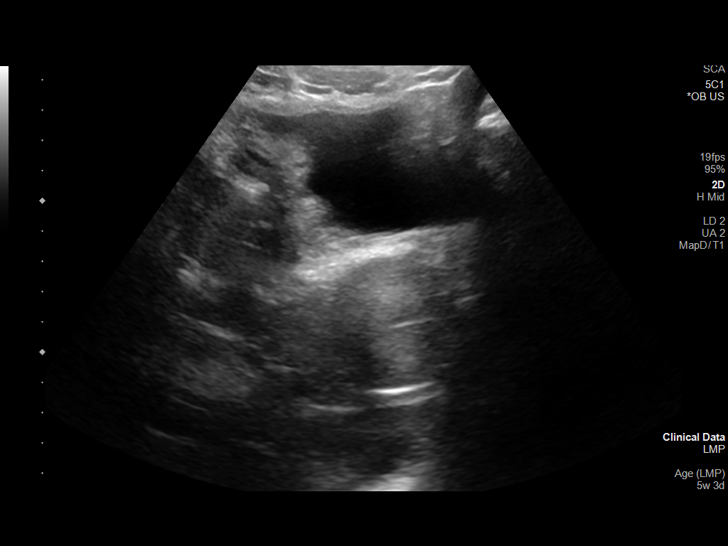
[im 6/31]
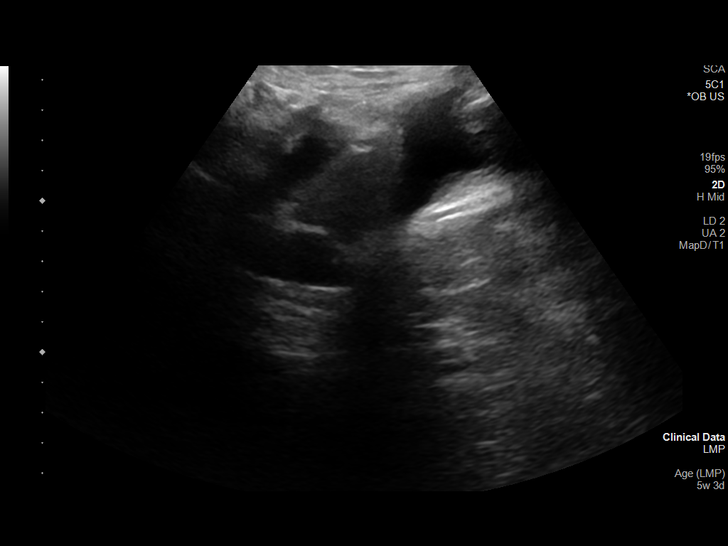
[im 8/31]
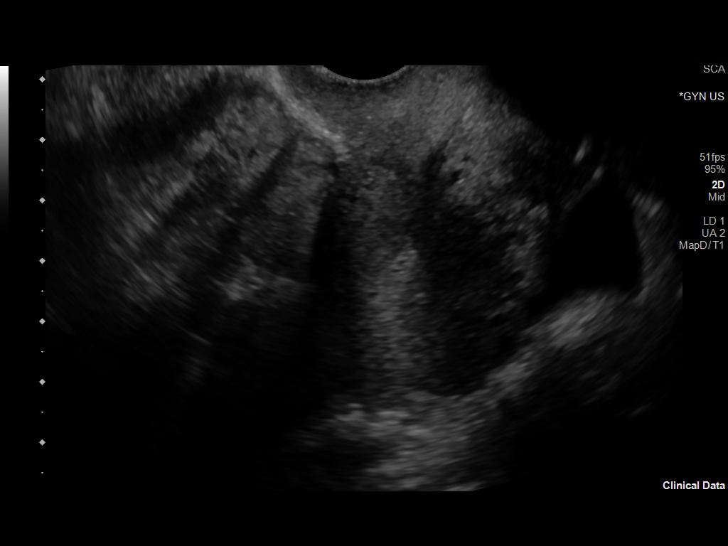
[im 11/31]
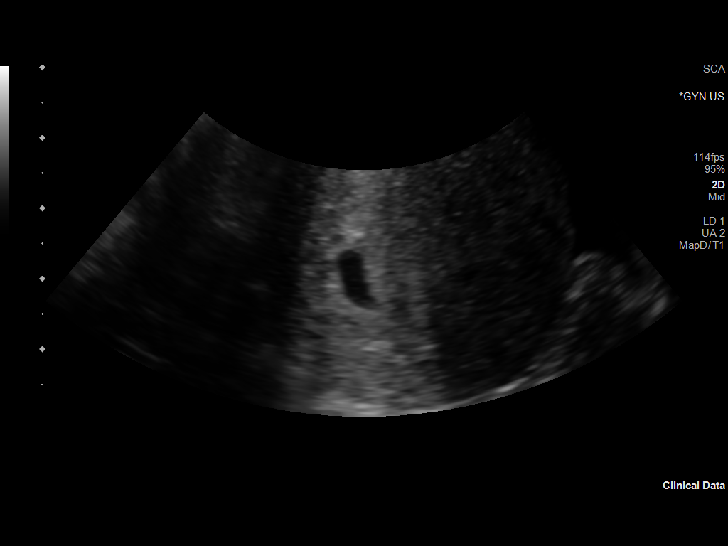
[im 13/31]
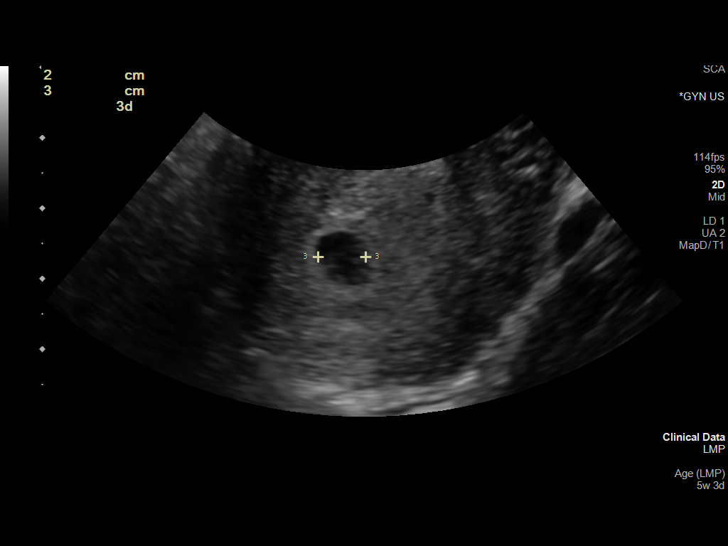
[im 16/31]
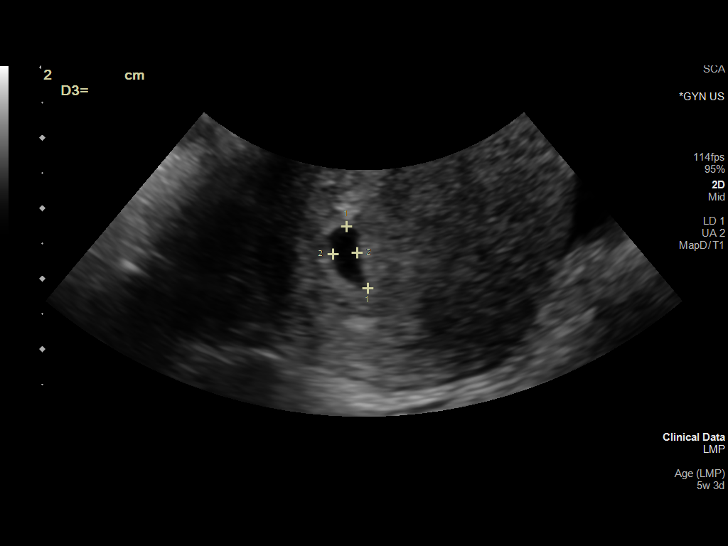
[im 18/31]
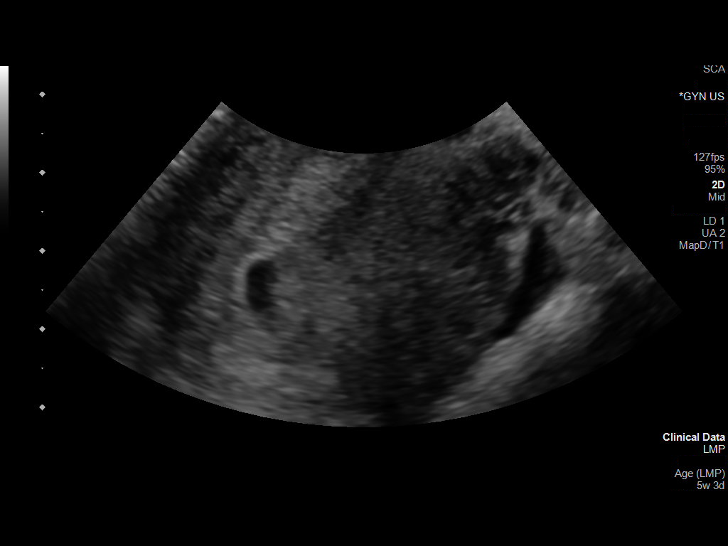
[im 21/31]
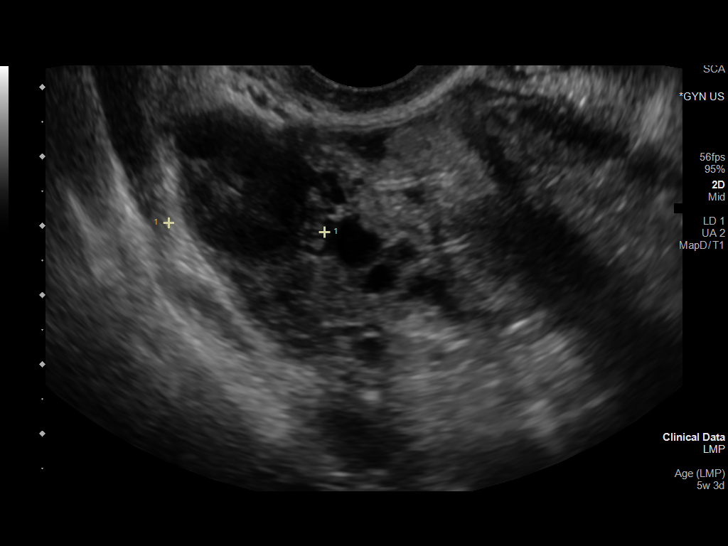
[im 23/31]
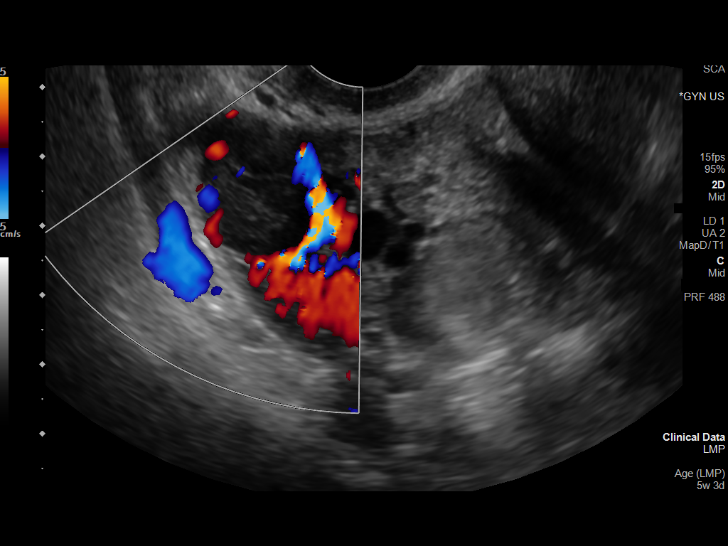
[im 25/31]
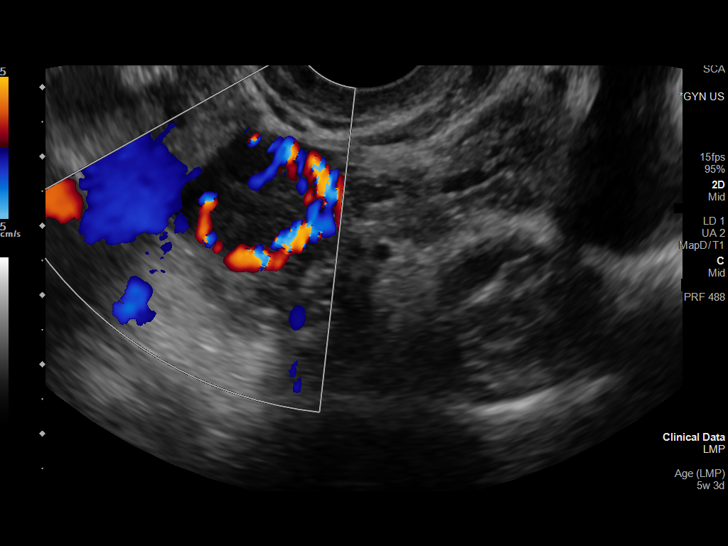
[im 27/31]
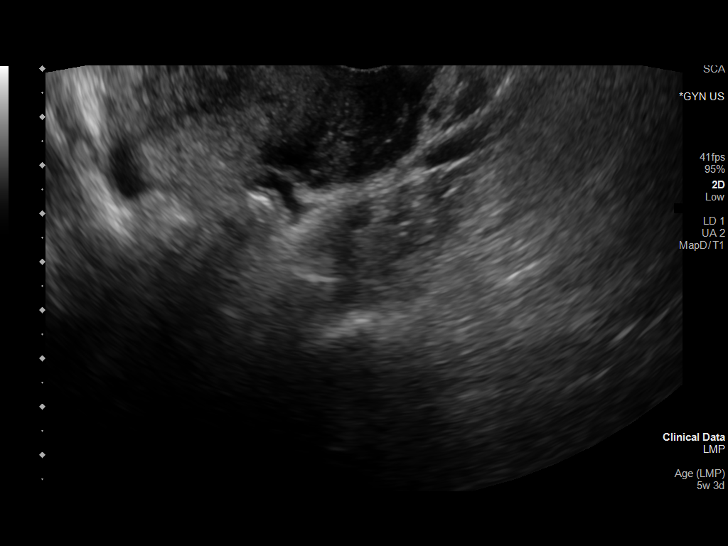
[im 29/31]
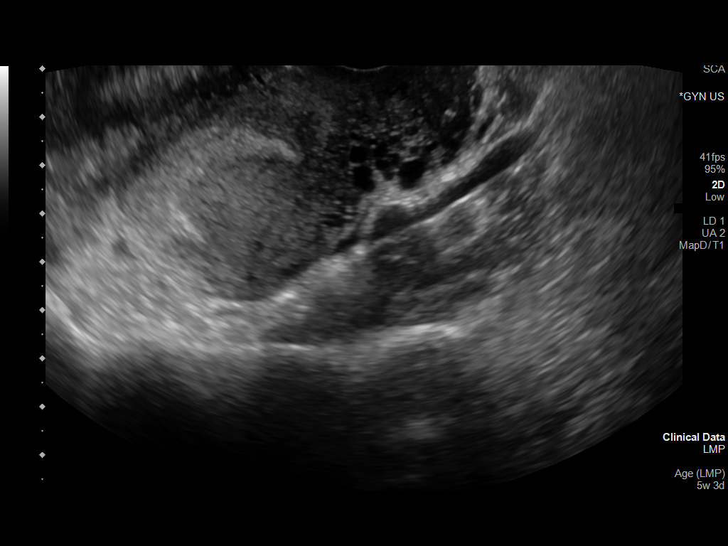

[13 of 28 positions shown; findings below may reference images not displayed]

FINDINGS: Intrauterine gestational sac: Single

Yolk sac:  Not visualized.

Embryo:  Not visualized.

Cardiac Activity: Negative.

Heart Rate: N/A  bpm

MSD: 6.1 mm   5 w   2 d

Subchorionic hemorrhage:  None visualized.

Maternal uterus/adnexae: Degenerating corpus luteal cyst seen within
the right ovary. Left ovary not visualized. No adnexal mass or free
fluid.
IMPRESSION: 1. Probable early intrauterine gestational sac, but no yolk sac,
fetal pole, or cardiac activity yet visualized. Recommend follow-up
quantitative B-HCG levels and follow-up US in 14 days to confirm and
assess viability. This recommendation follows SRU consensus
guidelines: Diagnostic Criteria for Nonviable Pregnancy Early in the
First Trimester. N Engl J Med 7879; [DATE].
2. Degenerating right ovarian corpus luteal cyst, which could
contribute to underlying back pain.
3. No other acute maternal uterine or adnexal abnormality
identified.

## 2021-04-04 ENCOUNTER — Other Ambulatory Visit: Payer: Self-pay

## 2021-04-04 ENCOUNTER — Encounter (HOSPITAL_BASED_OUTPATIENT_CLINIC_OR_DEPARTMENT_OTHER): Payer: Self-pay | Admitting: *Deleted

## 2021-04-04 ENCOUNTER — Emergency Department (HOSPITAL_BASED_OUTPATIENT_CLINIC_OR_DEPARTMENT_OTHER)
Admission: EM | Admit: 2021-04-04 | Discharge: 2021-04-04 | Disposition: A | Discharge status: Home / Self Care | Payer: 59 | Attending: Emergency Medicine | Admitting: Emergency Medicine

## 2021-04-04 DIAGNOSIS — Z5321 Procedure and treatment not carried out due to patient leaving prior to being seen by health care provider: Secondary | ICD-10-CM | POA: Insufficient documentation

## 2021-04-04 DIAGNOSIS — R109 Unspecified abdominal pain: Secondary | ICD-10-CM | POA: Insufficient documentation

## 2021-04-04 LAB — URINALYSIS, ROUTINE W REFLEX MICROSCOPIC
Bilirubin Urine: NEGATIVE
Glucose, UA: NEGATIVE mg/dL
Hgb urine dipstick: NEGATIVE
Ketones, ur: NEGATIVE mg/dL
Leukocytes,Ua: NEGATIVE
Nitrite: NEGATIVE
Protein, ur: NEGATIVE mg/dL
Specific Gravity, Urine: 1.03 (ref 1.005–1.030)
pH: 6 (ref 5.0–8.0)

## 2021-04-04 LAB — PREGNANCY, URINE: Preg Test, Ur: NEGATIVE

## 2021-04-04 NOTE — ED Triage Notes (Signed)
Pt had a miscarriage late last month.  She found out when she was [redacted] weeks pregnant that the pregnancy was not viable and was given a pill to have her miscarriage on 7/30.  Pt continues to feel abdominal pain and cramping and states that it feels like she is having a period but there is no vaginal bleeding.  Pt did not have any follow up appointment.  Pt asks if it is possible that she could be pregnant again, she has been sexually active.

## 2021-04-06 ENCOUNTER — Inpatient Hospital Stay (HOSPITAL_COMMUNITY): Payer: 59

## 2021-04-06 ENCOUNTER — Other Ambulatory Visit: Payer: Self-pay

## 2021-04-06 ENCOUNTER — Inpatient Hospital Stay (HOSPITAL_COMMUNITY)
Admission: AD | Admit: 2021-04-06 | Discharge: 2021-04-06 | Disposition: A | Discharge status: Home / Self Care | Payer: 59 | Attending: Obstetrics and Gynecology | Admitting: Obstetrics and Gynecology

## 2021-04-06 ENCOUNTER — Encounter (HOSPITAL_COMMUNITY): Payer: Self-pay | Admitting: Obstetrics and Gynecology

## 2021-04-06 DIAGNOSIS — Z3201 Encounter for pregnancy test, result positive: Secondary | ICD-10-CM

## 2021-04-06 DIAGNOSIS — Z87891 Personal history of nicotine dependence: Secondary | ICD-10-CM | POA: Diagnosis not present

## 2021-04-06 DIAGNOSIS — R109 Unspecified abdominal pain: Secondary | ICD-10-CM | POA: Insufficient documentation

## 2021-04-06 DIAGNOSIS — O3680X0 Pregnancy with inconclusive fetal viability, not applicable or unspecified: Secondary | ICD-10-CM | POA: Diagnosis not present

## 2021-04-06 DIAGNOSIS — Z3A00 Weeks of gestation of pregnancy not specified: Secondary | ICD-10-CM | POA: Insufficient documentation

## 2021-04-06 DIAGNOSIS — O26899 Other specified pregnancy related conditions, unspecified trimester: Secondary | ICD-10-CM | POA: Insufficient documentation

## 2021-04-06 LAB — CBC
HCT: 30.3 % — ABNORMAL LOW (ref 36.0–46.0)
Hemoglobin: 9.3 g/dL — ABNORMAL LOW (ref 12.0–15.0)
MCH: 23.4 pg — ABNORMAL LOW (ref 26.0–34.0)
MCHC: 30.7 g/dL (ref 30.0–36.0)
MCV: 76.1 fL — ABNORMAL LOW (ref 80.0–100.0)
Platelets: 382 10*3/uL (ref 150–400)
RBC: 3.98 MIL/uL (ref 3.87–5.11)
RDW: 15.5 % (ref 11.5–15.5)
WBC: 3.3 10*3/uL — ABNORMAL LOW (ref 4.0–10.5)
nRBC: 0 % (ref 0.0–0.2)

## 2021-04-06 LAB — URINALYSIS, ROUTINE W REFLEX MICROSCOPIC
Bilirubin Urine: NEGATIVE
Glucose, UA: NEGATIVE mg/dL
Hgb urine dipstick: NEGATIVE
Ketones, ur: NEGATIVE mg/dL
Leukocytes,Ua: NEGATIVE
Nitrite: NEGATIVE
Protein, ur: NEGATIVE mg/dL
Specific Gravity, Urine: 1.02 (ref 1.005–1.030)
pH: 6 (ref 5.0–8.0)

## 2021-04-06 LAB — WET PREP, GENITAL
Sperm: NONE SEEN
Trich, Wet Prep: NONE SEEN
Yeast Wet Prep HPF POC: NONE SEEN

## 2021-04-06 LAB — HCG, QUANTITATIVE, PREGNANCY: hCG, Beta Chain, Quant, S: 11 m[IU]/mL — ABNORMAL HIGH (ref <5)

## 2021-04-06 NOTE — Discharge Instructions (Addendum)
Call your OB/GYN office tomorrow to schedule follow up. Return to MAU if you develop severe pain or bleeding.

## 2021-04-06 NOTE — MAU Provider Note (Signed)
History    [id number]  Arrival date and time: 04/06/21 1246  Chief Complaint  Patient presents with   Abdominal Pain   Possible Pregnancy    HPI Dawn Proctor is a 25 y.o. female with no significant PMH who presents to MAU with abdominal cramping and positive home pregnancy test. Patient reports that she was diagnosed with a miscarriage ~7/27. She was treated with medication to help with passage of products which resolved by 7/31. She has not had any bleeding since then. Over the last few days she has had some abdominal cramping as if her period was going to come but it did not. She reports taking a pregnancy test at home on 8/19 which was positive. She went to the adult ED and had a negative UPT and unremarkable UA but left before additional workup could be performed. She subsequently presented again today due to ongoing symptoms and concern for either new pregnancy or possible retained products from her miscarriage.  --/--/O POS (01/16 1005)  OB History     Gravida  5   Para  2   Term  2   Preterm      AB  3   Living  2      SAB  1   IAB  2   Ectopic      Multiple  0   Live Births  2           Past Medical History:  Diagnosis Date   Allergy    Anxiety    Headache    Knee fracture 2015   Seasonal allergies     Past Surgical History:  Procedure Laterality Date   I & D EXTREMITY Left 03/04/2014   Procedure: IRRIGATION AND DEBRIDEMENT KNEE ARTHROTOMY WITH REMOVAL OF BULLET FRAGMENTS;  Surgeon: Eldred Manges, MD;  Location: [facility] OR;  Service: Orthopedics;  Laterality: Left;   KNEE SURGERY      Family History  Problem Relation Age of Onset   Emphysema Paternal Grandmother    Dementia Maternal Grandmother     Social History   Socioeconomic History   Marital status: Single    Spouse name: n/a   Number of children: 0   Years of education: Not on file   Highest education level: Not on file  Occupational History   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  Her sister is in college, and her older brother lives/works in [place].  Plans [employer] next year, to major in forensic psychology.     Social Determinants of Health   Financial Resource Strain: Not on file  Food Insecurity: Not on file  Transportation Needs: Not on file  Physical Activity: Not on file  Stress: Not on file  Social Connections: Not on file  Intimate Partner Violence: Not on file    Allergies  Allergen Reactions   Orange Fruit [Citrus] Swelling    Swelling around the lips   Penicillins Hives    Has patient had a PCN reaction causing immediate rash, facial/tongue/throat swelling, SOB  or lightheadedness with hypotension: No Has patient had a PCN reaction causing severe rash involving mucus membranes or skin necrosis: No Has patient had a PCN reaction that required hospitalization No Has patient had a PCN reaction occurring within the last 10 years: Yes If all of the above answers are "NO", then may proceed with Cephalosporin use.    Tomato Swelling    Swelling around the lips    No current facility-administered medications on file prior to encounter.   Current Outpatient Medications on File Prior to Encounter  Medication Sig Dispense Refill   acetaminophen (TYLENOL) 325 MG tablet Take 2 tablets (650 mg total) by mouth every 4 (four) hours as needed (for pain scale < 4). 30 tablet 0   fluticasone (FLONASE) 50 MCG/ACT nasal spray Place 1 spray into both nostrils in the morning and at bedtime. 9.9 mL 2    Review of Systems  Constitutional:  Negative for chills and fever.   Cardiovascular:  Negative for leg swelling.  Gastrointestinal:  Positive for abdominal pain (cramping in lower abdomen). Negative for nausea and vomiting.  Genitourinary:  Negative for dysuria.       No vaginal discharge or irritation  Pertinent positives and negative per HPI, all others reviewed and negative  Physical Exam   BP 101/69 (BP Location: Right Arm)   Pulse (!) 56   Temp 98 F (36.7 C) (Oral)   Resp 15   Ht 5\' 6"  (1.676 m)   Wt 63 kg   SpO2 100%   BMI 22.40 kg/m   Physical Exam Constitutional:      General: She is not in acute distress.    Appearance: She is well-developed.  HENT:     Head: Normocephalic and atraumatic.  Cardiovascular:     Rate and Rhythm: Normal rate.  Pulmonary:     Effort: Pulmonary effort is normal.  Abdominal:     General: There is no distension.     Palpations: Abdomen is soft.     Tenderness: There is no abdominal tenderness.  Skin:    General: Skin is warm and dry.  Neurological:     Mental Status: She is alert and oriented to person, place, and time.  Psychiatric:        Mood and Affect: Mood normal.        Behavior: Behavior normal.   MAU Course  Procedures Lab Orders         Wet prep, genital         CBC         hCG, quantitative, pregnancy         Urinalysis, Routine w reflex microscopic         Pregnancy, urine POC     Imaging Orders         OB LESS THAN 14 WEEKS WITH OB TRANSVAGINAL      MDM UPT positive in MAU Exam benign and reassuring CBC with Hgb 9.3; similar to prior  O POS Urinalysis unremarkable  Wet prep with clue cells - asymptomatic GC/CT negative hCG level 11 [place] with small gestational sac in upper uterine segment without yolk sac or fetal pole Overall most consistent with early pregnancy of unknown location  Assessment and Plan  1. Abdominal cramping 2. Positive pregnancy test 3. Pregnancy of unknown anatomic location - Discussed case with Dr. [place] who recommended close follow up  in their clinic this week - Patient advised to call and schedule this appointment per Dr. Lavina Hamman  recommendations  - Stable for discharge  - Strict return precautions reviewed should she experience worsening pain or bleeding - Patient voiced understanding and is agreeable to plan of care   Worthy Rancher, MD [facility] Fellow  Faculty Practice

## 2021-04-06 NOTE — MAU Note (Signed)
Pt's UPT is +

## 2021-04-06 NOTE — MAU Note (Signed)
Dawn Proctor is a 25 y.o. here in MAU reporting: a month ago she had a miscarriage and was given cytotec. States she feel like she passed everything. The past week has started having intermittent cramping but not bleeding. But also is having headaches. Recent has had 6 + UPT and on Friday was at [facility] and had a negative UPT.  LMP: none since miscarriage  Onset of complaint: ongoing  Pain score: 0/10  Vitals:   04/06/21 1257  BP: (!) 109/54  Pulse: 77  Resp: 16  Temp: 98 F (36.7 C)  SpO2: 100%     Lab orders placed from triage: none

## 2021-04-06 NOTE — MAU Note (Signed)
Pt has 2 young children with her. Informed pt of + UPT here and that she would need to find childcare in order to proceed with POC. Pt attempting to find child care.

## 2021-04-07 LAB — GC/CHLAMYDIA PROBE AMP (~~LOC~~) NOT AT ARMC
Chlamydia: NEGATIVE
Comment: NEGATIVE
Comment: NORMAL
Neisseria Gonorrhea: NEGATIVE

## 2021-04-07 LAB — POCT PREGNANCY, URINE: Preg Test, Ur: POSITIVE — AB

## 2021-06-08 ENCOUNTER — Emergency Department (HOSPITAL_BASED_OUTPATIENT_CLINIC_OR_DEPARTMENT_OTHER)
Admission: EM | Admit: 2021-06-08 | Discharge: 2021-06-08 | Disposition: A | Discharge status: Home / Self Care | Payer: 59 | Attending: Emergency Medicine | Admitting: Emergency Medicine

## 2021-06-08 ENCOUNTER — Other Ambulatory Visit: Payer: Self-pay

## 2021-06-08 ENCOUNTER — Encounter (HOSPITAL_BASED_OUTPATIENT_CLINIC_OR_DEPARTMENT_OTHER): Payer: Self-pay | Admitting: Emergency Medicine

## 2021-06-08 DIAGNOSIS — S59912A Unspecified injury of left forearm, initial encounter: Secondary | ICD-10-CM | POA: Diagnosis present

## 2021-06-08 DIAGNOSIS — S50812A Abrasion of left forearm, initial encounter: Secondary | ICD-10-CM | POA: Diagnosis not present

## 2021-06-08 DIAGNOSIS — Z87891 Personal history of nicotine dependence: Secondary | ICD-10-CM | POA: Insufficient documentation

## 2021-06-08 DIAGNOSIS — Y92410 Unspecified street and highway as the place of occurrence of the external cause: Secondary | ICD-10-CM | POA: Diagnosis not present

## 2021-06-08 MED ORDER — IBUPROFEN 400 MG PO TABS
600.0000 mg | ORAL_TABLET | Freq: Once | ORAL | Status: AC
  Administered 2021-06-08: 600 mg via ORAL
  Filled 2021-06-08: qty 1

## 2021-06-08 MED ORDER — METHOCARBAMOL 500 MG PO TABS: 500.0000 mg | ORAL_TABLET | Freq: Two times a day (BID) | ORAL | 0 refills | Status: DC
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
## 2021-07-01 ENCOUNTER — Emergency Department (HOSPITAL_BASED_OUTPATIENT_CLINIC_OR_DEPARTMENT_OTHER)
Admission: EM | Admit: 2021-07-01 | Discharge: 2021-07-01 | Disposition: A | Discharge status: Home / Self Care | Payer: 59 | Attending: Emergency Medicine | Admitting: Emergency Medicine

## 2021-07-01 ENCOUNTER — Other Ambulatory Visit: Payer: Self-pay

## 2021-07-01 ENCOUNTER — Encounter (HOSPITAL_BASED_OUTPATIENT_CLINIC_OR_DEPARTMENT_OTHER): Payer: Self-pay | Admitting: *Deleted

## 2021-07-01 DIAGNOSIS — R102 Pelvic and perineal pain: Secondary | ICD-10-CM | POA: Insufficient documentation

## 2021-07-01 DIAGNOSIS — O26899 Other specified pregnancy related conditions, unspecified trimester: Secondary | ICD-10-CM | POA: Diagnosis present

## 2021-07-01 DIAGNOSIS — R202 Paresthesia of skin: Secondary | ICD-10-CM | POA: Diagnosis not present

## 2021-07-01 DIAGNOSIS — Z3A00 Weeks of gestation of pregnancy not specified: Secondary | ICD-10-CM | POA: Diagnosis not present

## 2021-07-01 DIAGNOSIS — Z87891 Personal history of nicotine dependence: Secondary | ICD-10-CM | POA: Insufficient documentation

## 2021-07-01 DIAGNOSIS — R519 Headache, unspecified: Secondary | ICD-10-CM | POA: Diagnosis not present

## 2021-07-01 DIAGNOSIS — Z3201 Encounter for pregnancy test, result positive: Secondary | ICD-10-CM | POA: Insufficient documentation

## 2021-07-01 LAB — CBC WITH DIFFERENTIAL/PLATELET
Abs Immature Granulocytes: 0.01 10*3/uL (ref 0.00–0.07)
Basophils Absolute: 0 10*3/uL (ref 0.0–0.1)
Basophils Relative: 1 %
Eosinophils Absolute: 0 10*3/uL (ref 0.0–0.5)
Eosinophils Relative: 1 %
HCT: 35.7 % — ABNORMAL LOW (ref 36.0–46.0)
Hemoglobin: 11 g/dL — ABNORMAL LOW (ref 12.0–15.0)
Immature Granulocytes: 0 %
Lymphocytes Relative: 48 %
Lymphs Abs: 1.4 10*3/uL (ref 0.7–4.0)
MCH: 22.5 pg — ABNORMAL LOW (ref 26.0–34.0)
MCHC: 30.8 g/dL (ref 30.0–36.0)
MCV: 73.2 fL — ABNORMAL LOW (ref 80.0–100.0)
Monocytes Absolute: 0.2 10*3/uL (ref 0.1–1.0)
Monocytes Relative: 8 %
Neutro Abs: 1.3 10*3/uL — ABNORMAL LOW (ref 1.7–7.7)
Neutrophils Relative %: 42 %
Platelets: 363 10*3/uL (ref 150–400)
RBC: 4.88 MIL/uL (ref 3.87–5.11)
RDW: 14.9 % (ref 11.5–15.5)
WBC: 3 10*3/uL — ABNORMAL LOW (ref 4.0–10.5)
nRBC: 0 % (ref 0.0–0.2)

## 2021-07-01 LAB — COMPREHENSIVE METABOLIC PANEL
ALT: 17 U/L (ref 0–44)
AST: 18 U/L (ref 15–41)
Albumin: 4 g/dL (ref 3.5–5.0)
Alkaline Phosphatase: 50 U/L (ref 38–126)
Anion gap: 7 (ref 5–15)
BUN: 10 mg/dL (ref 6–20)
CO2: 23 mmol/L (ref 22–32)
Calcium: 8.5 mg/dL — ABNORMAL LOW (ref 8.9–10.3)
Chloride: 104 mmol/L (ref 98–111)
Creatinine, Ser: 0.79 mg/dL (ref 0.44–1.00)
GFR, Estimated: >60 mL/min (ref >60)
Glucose, Bld: 99 mg/dL (ref 70–99)
Potassium: 3.8 mmol/L (ref 3.5–5.1)
Sodium: 134 mmol/L — ABNORMAL LOW (ref 135–145)
Total Bilirubin: 0.5 mg/dL (ref 0.3–1.2)
Total Protein: 6.8 g/dL (ref 6.5–8.1)

## 2021-07-01 LAB — PREGNANCY, URINE: Preg Test, Ur: POSITIVE — AB

## 2021-07-01 LAB — URINALYSIS, ROUTINE W REFLEX MICROSCOPIC
Bilirubin Urine: NEGATIVE
Glucose, UA: NEGATIVE mg/dL
Hgb urine dipstick: NEGATIVE
Ketones, ur: NEGATIVE mg/dL
Leukocytes,Ua: NEGATIVE
Nitrite: NEGATIVE
Protein, ur: NEGATIVE mg/dL
Specific Gravity, Urine: 1.025 (ref 1.005–1.030)
pH: 6.5 (ref 5.0–8.0)

## 2021-07-01 LAB — HCG, QUANTITATIVE, PREGNANCY: hCG, Beta Chain, Quant, S: 60 m[IU]/mL — ABNORMAL HIGH (ref <5)

## 2021-07-01 LAB — LIPASE, BLOOD: Lipase: 26 U/L (ref 11–51)
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
## 2021-07-01 NOTE — ED Triage Notes (Signed)
Pain in her lower abdomen and back x 5 days. Headache. She has had pain radiating down her right leg. This is not new.

## 2021-07-01 NOTE — ED Notes (Signed)
Pt states she had miscarriage in July 27 and then her preg test was positive in August,  today states abd pain , feeling tired and h/a for several days states her period not due till the 30 of nov

## 2021-07-01 NOTE — Discharge Instructions (Signed)
I spoke to Dr. Claiborne Billings with [facility].  Please call their office back and schedule an appointment for 2 days from now to have your hCG rechecked.  If you develop any new or worsening symptoms please come back to the emergency department immediately for reevaluation.

## 2021-08-15 LAB — HEPATITIS C ANTIBODY: HCV Ab: NEGATIVE

## 2021-08-17 NOTE — L&D Delivery Note (Signed)
Delivery Note Dawn Proctor is a [id number] at [redacted]w[redacted]d who had a spontaneous delivery at 1349 a viable female [facility]") was delivered via LOP.   APGAR: 8, 9; weight 5lb14oz (8875Z)  .     Admitted for labor. Progressed normally. Received epidural for pain management.  Augmented with AROM and pitocin. Pushed for 19 minutes. Baby was delivered without difficulty. Tight nuchal cord x 2 reduced at perineum.  Delayed cord clamping for 60 seconds.  Delivery of placenta was spontaneous. Placenta was found to be intact, 3 -vessel cord was noted. The fundus was found to be firm. Perineum intact Estimated blood loss 50cc. Instrument and gauze counts were correct at the end of the procedure.   Placenta status: to pathology due to preterm labor  Anesthesia:  epidural Episiotomy:  none Lacerations:   none Suture Repair: none Est. Blood Loss (mL):  50  Mom to postpartum.  Baby to Couplet care / Skin to Skin.  Charlett Nose 02/14/2022, 4:07 PM

## 2021-09-03 IMAGING — DX DG CHEST 1V
1 series · 1 of 1 positions shown · non-contrast
Comparison: None.

CLINICAL DATA: Headache, subjective fever and body aches.

EXAM:
CHEST  1 VIEW

[chest]
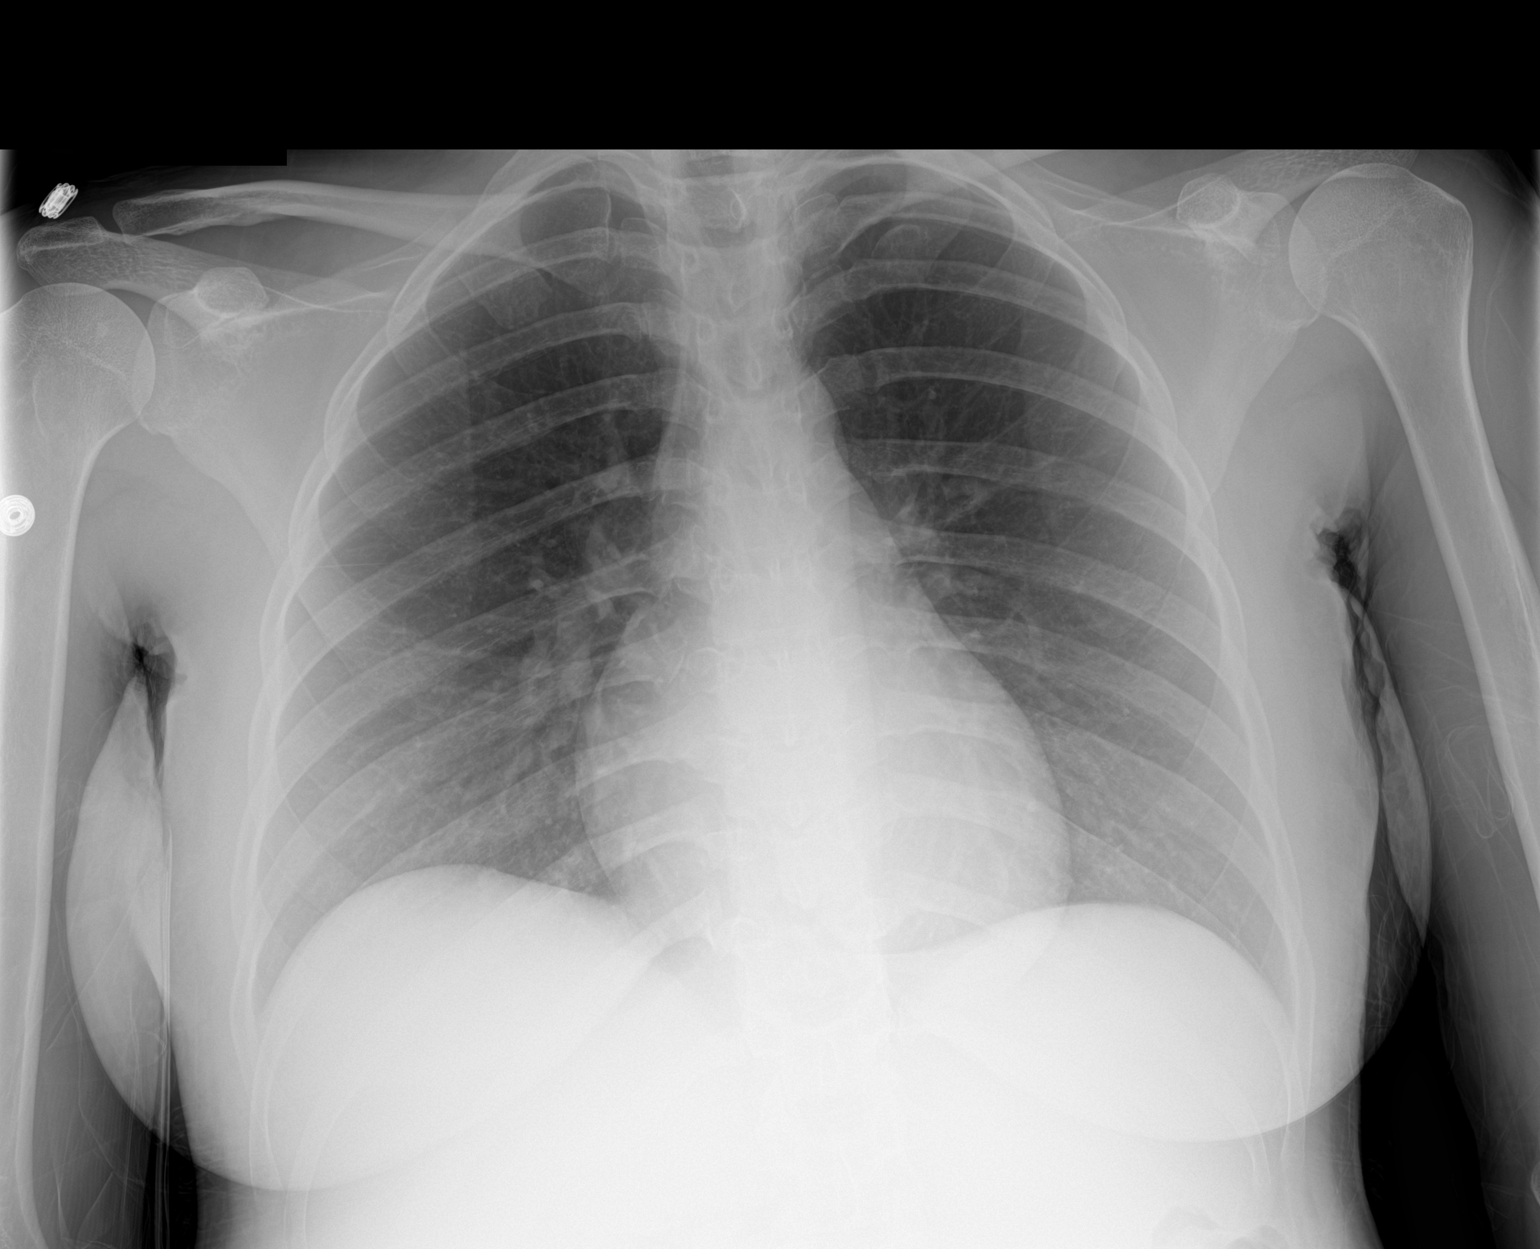

[1 of 1 positions shown; findings below may reference images not displayed]

FINDINGS: Lungs clear. Heart size normal. No pneumothorax or pleural fluid. No
bony abnormality.
IMPRESSION: Normal chest.

## 2021-10-07 ENCOUNTER — Encounter (HOSPITAL_COMMUNITY): Payer: Self-pay | Admitting: *Deleted

## 2021-10-07 ENCOUNTER — Inpatient Hospital Stay (HOSPITAL_COMMUNITY)
Admission: AD | Admit: 2021-10-07 | Discharge: 2021-10-07 | Disposition: A | Discharge status: Home / Self Care | Payer: Medicaid Other | Attending: Obstetrics and Gynecology | Admitting: Obstetrics and Gynecology

## 2021-10-07 ENCOUNTER — Other Ambulatory Visit: Payer: Self-pay

## 2021-10-07 DIAGNOSIS — R519 Headache, unspecified: Secondary | ICD-10-CM | POA: Diagnosis not present

## 2021-10-07 DIAGNOSIS — A599 Trichomoniasis, unspecified: Secondary | ICD-10-CM | POA: Insufficient documentation

## 2021-10-07 DIAGNOSIS — Z3A17 17 weeks gestation of pregnancy: Secondary | ICD-10-CM | POA: Insufficient documentation

## 2021-10-07 DIAGNOSIS — O26892 Other specified pregnancy related conditions, second trimester: Secondary | ICD-10-CM | POA: Diagnosis not present

## 2021-10-07 LAB — URINALYSIS, ROUTINE W REFLEX MICROSCOPIC
Bilirubin Urine: NEGATIVE
Glucose, UA: NEGATIVE mg/dL
Hgb urine dipstick: NEGATIVE
Ketones, ur: NEGATIVE mg/dL
Nitrite: NEGATIVE
Protein, ur: NEGATIVE mg/dL
Specific Gravity, Urine: 1.02 (ref 1.005–1.030)
pH: 6 (ref 5.0–8.0)

## 2021-10-07 LAB — WET PREP, GENITAL
Clue Cells Wet Prep HPF POC: NONE SEEN
Sperm: NONE SEEN
WBC, Wet Prep HPF POC: >=10 — AB (ref <10)
Yeast Wet Prep HPF POC: NONE SEEN

## 2021-10-07 MED ORDER — METRONIDAZOLE 500 MG PO TABS
2000.0000 mg | ORAL_TABLET | Freq: Once | ORAL | Status: AC
  Administered 2021-10-07: 2000 mg via ORAL
  Filled 2021-10-07: qty 4

## 2021-10-07 NOTE — MAU Note (Signed)
Presents with c/o lower abdominal pain that began @ approximately 1330 this afternoon.  Reports the pain is intermittent, was constant earlier.  Also reports has a H/A, took Tylenol @ 1330 for both cramping & H/A, pain unrelived.  States also has vaginal discharge that's increased in amount, reportss aturated through panties onto clothing.  Denies vaginal itching, odor, or bleeding.

## 2021-10-07 NOTE — MAU Provider Note (Signed)
History     CSN: [id number]  Arrival date and time: 10/07/21 1729   Event Date/Time   First Provider Initiated Contact with Patient 10/07/21 1825      Chief Complaint  Patient presents with   Abdominal Pain   Headache   Vaginal Discharge   HPI Dawn Proctor is a 26 y.o. [id number] at [redacted]w[redacted]d who presents with vaginal discharge anda  headache. She reports the vaginal discharge is yellow and soaking her clothes. She reports intermittent lower abdominal cramping. She also reports a headache that she rates a 4/10. She tried tylenol with no relief. She denies any bleeding or leaking.    OB History     Gravida  6   Para  2   Term  2   Preterm      AB  3   Living  2      SAB  1   IAB  2   Ectopic      Multiple  0   Live Births  2           Past Medical History:  Diagnosis Date   Allergy    Anxiety    Headache    Knee fracture 2015   Seasonal allergies     Past Surgical History:  Procedure Laterality Date   I & D EXTREMITY Left 03/04/2014   Procedure: IRRIGATION AND DEBRIDEMENT KNEE ARTHROTOMY WITH REMOVAL OF BULLET FRAGMENTS;  Surgeon: Eldred Manges, MD;  Location: [facility] OR;  Service: Orthopedics;  Laterality: Left;   KNEE SURGERY      Family History  Problem Relation Age of Onset   Emphysema Paternal Grandmother    Dementia Maternal Grandmother     Social History   Tobacco Use   Smoking status: Former    Packs/day: 0.50    Types: Cigarettes   Smokeless tobacco: Never  Vaping Use   Vaping Use: Never used  Substance Use Topics   Alcohol use: Not Currently   Drug use: Not Currently    Types: Marijuana    Comment: occ    Allergies:  Allergies  Allergen Reactions   Orange Fruit [Citrus] Swelling    Swelling around the lips   Penicillins Hives    Has patient had a PCN reaction causing immediate rash, facial/tongue/throat swelling, SOB or lightheadedness with hypotension: No Has patient had a PCN reaction causing severe rash involving mucus  membranes or skin necrosis: No Has patient had a PCN reaction that required hospitalization No Has patient had a PCN reaction occurring within the last 10 years: Yes If all of the above answers are "NO", then may proceed with Cephalosporin use.    Tomato Swelling    Swelling around the lips    Medications Prior to Admission  Medication Sig Dispense Refill Last Dose   prenatal vitamin w/FE, FA (NATACHEW) 29-1 MG CHEW chewable tablet Chew 1 tablet by mouth daily at 12 noon.      acetaminophen (TYLENOL) 325 MG tablet Take 2 tablets (650 mg total) by mouth every 4 (four) hours as needed (for pain scale < 4). 30 tablet 0    fluticasone (FLONASE) 50 MCG/ACT nasal spray Place 1 spray into both nostrils in the morning and at bedtime. 9.9 mL 2    methocarbamol (ROBAXIN) 500 MG tablet Take 1 tablet (500 mg total) by mouth 2 (two) times daily. 20 tablet 0     Review of Systems  Constitutional: Negative.  Negative for fatigue and fever.  HENT:  Negative.    Respiratory: Negative.  Negative for shortness of breath.   Cardiovascular: Negative.  Negative for chest pain.  Gastrointestinal:  Positive for abdominal pain. Negative for constipation, diarrhea, nausea and vomiting.  Genitourinary:  Positive for vaginal discharge. Negative for dysuria.  Neurological:  Positive for headaches. Negative for dizziness.  Physical Exam   Blood pressure (!) 105/53, pulse 76, temperature 98 F (36.7 C), temperature source Oral, resp. rate 19, height 5\' 6"  (1.676 m), weight 64.1 kg, last menstrual period 06/07/2021, SpO2 100 %, unknown if currently breastfeeding.  Physical Exam Vitals and nursing note reviewed.  Constitutional:      General: She is not in acute distress.    Appearance: She is well-developed.  HENT:     Head: Normocephalic.  Eyes:     Pupils: Pupils are equal, round, and reactive to light.  Cardiovascular:     Rate and Rhythm: Normal rate and regular rhythm.     Heart sounds: Normal heart  sounds.  Pulmonary:     Effort: Pulmonary effort is normal. No respiratory distress.     Breath sounds: Normal breath sounds.  Abdominal:     General: Bowel sounds are normal. There is no distension.     Palpations: Abdomen is soft.     Tenderness: There is no abdominal tenderness.  Genitourinary:    Comments: SSE: copious yellow discharge, cervix visually closed Skin:    General: Skin is warm and dry.  Neurological:     Mental Status: She is alert and oriented to person, place, and time.  Psychiatric:        Mood and Affect: Mood normal.        Behavior: Behavior normal.        Thought Content: Thought content normal.        Judgment: Judgment normal.   FHT: 150 bpm   Cervix: closed/thick/posterior  MAU Course  Procedures Results for orders placed or performed during the hospital encounter of 10/07/21 (from the past 24 hour(s))  Urinalysis, Routine w reflex microscopic Urine, Clean Catch     Status: Abnormal   Collection Time: 10/07/21  6:13 PM  Result Value Ref Range   Color, Urine YELLOW YELLOW   APPearance HAZY (A) CLEAR   Specific Gravity, Urine 1.020 1.005 - 1.030   pH 6.0 5.0 - 8.0   Glucose, UA NEGATIVE NEGATIVE mg/dL   Hgb urine dipstick NEGATIVE NEGATIVE   Bilirubin Urine NEGATIVE NEGATIVE   Ketones, ur NEGATIVE NEGATIVE mg/dL   Protein, ur NEGATIVE NEGATIVE mg/dL   Nitrite NEGATIVE NEGATIVE   Leukocytes,Ua LARGE (A) NEGATIVE   RBC / HPF 0-5 0 - 5 RBC/hpf   WBC, UA 11-20 0 - 5 WBC/hpf   Bacteria, UA RARE (A) NONE SEEN   Squamous Epithelial / LPF 6-10 0 - 5   Mucus PRESENT    Trichomonas, UA PRESENT (A) NONE SEEN  Wet prep, genital     Status: Abnormal   Collection Time: 10/07/21  6:34 PM  Result Value Ref Range   Yeast Wet Prep HPF POC NONE SEEN NONE SEEN   Trich, Wet Prep PRESENT (A) NONE SEEN   Clue Cells Wet Prep HPF POC NONE SEEN NONE SEEN   WBC, Wet Prep HPF POC >=10 (A) <10   Sperm NONE SEEN     MDM UA Wet prep and gc/chlamydia  Discussed  results of positive trichomoniasis and recommendations for treatment. Patient states she does not want to take that medication for the recommended  treatment and desires the one time dose.  Metronidazole 2g PO  Offered treatment of headache- patient declines. Requested PO hydration  Assessment and Plan   1. Trichomoniasis   2. [redacted] weeks gestation of pregnancy   3. Pregnancy headache in second trimester    -Discharge home in stable condition -Second trimester precautions discussed -Patient advised to follow-up with OB as scheduled for prenatal care -Patient may return to MAU as needed or if her condition were to change or worsen   Rolm Bookbinder CNM 10/07/2021, 6:25 PM

## 2021-10-07 NOTE — Discharge Instructions (Signed)

## 2021-10-08 LAB — GC/CHLAMYDIA PROBE AMP (~~LOC~~) NOT AT ARMC
Chlamydia: NEGATIVE
Comment: NEGATIVE
Comment: NORMAL
Neisseria Gonorrhea: NEGATIVE

## 2021-11-17 ENCOUNTER — Emergency Department (HOSPITAL_BASED_OUTPATIENT_CLINIC_OR_DEPARTMENT_OTHER)
Admission: EM | Admit: 2021-11-17 | Discharge: 2021-11-17 | Disposition: A | Discharge status: Home / Self Care | Payer: Medicaid Other | Attending: Emergency Medicine | Admitting: Emergency Medicine

## 2021-11-17 ENCOUNTER — Other Ambulatory Visit: Payer: Self-pay

## 2021-11-17 ENCOUNTER — Encounter (HOSPITAL_BASED_OUTPATIENT_CLINIC_OR_DEPARTMENT_OTHER): Payer: Self-pay

## 2021-11-17 DIAGNOSIS — J039 Acute tonsillitis, unspecified: Secondary | ICD-10-CM | POA: Diagnosis not present

## 2021-11-17 DIAGNOSIS — J029 Acute pharyngitis, unspecified: Secondary | ICD-10-CM | POA: Diagnosis present

## 2021-11-17 LAB — GROUP A STREP BY PCR: Group A Strep by PCR: NOT DETECTED

## 2021-11-17 MED ORDER — LIDOCAINE VISCOUS HCL 2 % MT SOLN: 5.0000 mL | Freq: Three times a day (TID) | OROMUCOSAL | 0 refills | Status: DC | PRN

## 2021-11-17 MED ORDER — CLINDAMYCIN HCL 300 MG PO CAPS: 300.0000 mg | ORAL_CAPSULE | Freq: Three times a day (TID) | ORAL | 0 refills | Status: AC

## 2021-11-17 NOTE — Discharge Instructions (Addendum)
Take the medication as prescribed, use Tylenol for pain ? ?Return for new or worsening symptoms ?

## 2021-11-17 NOTE — ED Triage Notes (Signed)
Pt arrives ambulatory to ED with c/o sore throat since last night pt reports she can see white spots in the back of her throat starting today. 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  ?

## 2021-11-17 NOTE — ED Provider Notes (Signed)
?[facility] EMERGENCY DEPARTMENT ?Provider Note ? ? ?CSN: [id number] ?Arrival date & time: 11/17/21  1706 ? ?  ? ?History ? ?Chief Complaint  ?Patient presents with  ? Sore Throat  ? ? ?Dawn Proctor is a 26 y.o. female with sore throat.  Noticed patches earlier today and some swollen tonsils.  Hurts to swallow.  No change in voice, fever.  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  No cough, congestion, rhinorrhea.  Able to tolerate p.o. intake. ? ?Preg> no abd pain, vag bleeding, fluid leakage ? ?HPI ? ?  ? ?Home Medications ?Prior to Admission medications   ?Medication Sig Start Date End Date Taking? Authorizing Provider  ?clindamycin (CLEOCIN) 300 MG capsule Take 1 capsule (300 mg total) by mouth 3 (three) times daily for 7 days. 11/17/21 11/24/21 Yes Khai Arrona A, PA-C  ?acetaminophen (TYLENOL) 325 MG tablet Take 2 tablets (650 mg total) by mouth every 4 (four) hours as needed (for pain scale < 4). 09/02/20   Huel Cote, MD  ?fluticasone (FLONASE) 50 MCG/ACT nasal spray Place 1 spray into both nostrils in the morning and at bedtime. 08/19/20 08/19/21  Levie Heritage, DO  ?methocarbamol (ROBAXIN) 500 MG tablet Take 1 tablet (500 mg total) by mouth 2 (two) times daily. 06/08/21   Gailen Shelter, PA  ?prenatal vitamin w/FE, FA (NATACHEW) 29-1 MG CHEW chewable tablet Chew 1 tablet by mouth daily at 12 noon.    [provider]  ?   ? ?Allergies    ?Orange fruit [citrus], Penicillins, and Tomato   ? ?Review of Systems   ?Review of Systems  ?Constitutional: Negative.   ?HENT:  Positive for sore throat. Negative for congestion, dental problem, drooling, ear discharge, facial swelling, mouth sores, nosebleeds, postnasal drip, rhinorrhea, sinus pressure, sinus pain, sneezing, trouble swallowing and voice change.   ?Respiratory: Negative.    ?Cardiovascular: Negative.   ?Gastrointestinal: Negative.   ?Genitourinary: Negative.   ?Musculoskeletal: Negative.   ?Skin: Negative.   ?Neurological:  Negative.   ?All other systems reviewed and are negative. ? ?Physical Exam ?Updated Vital Signs ?BP 102/63 (BP Location: Left Arm)   Pulse 82   Temp 98.1 ?F (36.7 ?C) (Oral)   Resp 16   Ht 5\' 6"  (1.676 m)   Wt 66.2 kg   LMP 06/07/2021   SpO2 100%   BMI 23.57 kg/m?  ?Physical Exam ?Vitals and nursing note reviewed.  ?Constitutional:   ?   General: She is not in acute distress. ?   Appearance: She is well-developed. She is not ill-appearing, toxic-appearing or diaphoretic.  ?HENT:  ?   Head: Normocephalic and atraumatic.  ?   Jaw: There is normal jaw occlusion.  ?   Comments: No facial swelling, submandibular area soft ?   Mouth/Throat:  ?   Lips: Pink.  ?   Mouth: Mucous membranes are moist.  ?   Pharynx: Oropharynx is clear. Uvula midline.  ?   Tonsils: Tonsillar exudate present. No tonsillar abscesses.  ?   Comments: Tongue midline.  Posterior oropharyngeal erythema and exudate.  Uvula midline.  No evidence of PTA or RPA sublingual area soft.  No pooling of secretions ?Eyes:  ?   Pupils: Pupils are equal, round, and reactive to light.  ?Neck:  ?   Trachea: Trachea and phonation normal.  ?   Comments: No neck stiffness neck rigidity ?Cardiovascular:  ?   Rate and Rhythm: Normal rate.  ?Pulmonary:  ?   Effort: Pulmonary  effort is normal. No respiratory distress.  ?   Breath sounds: Normal breath sounds and air entry.  ?Abdominal:  ?   General: Bowel sounds are normal. There is no distension.  ?   Palpations: Abdomen is soft.  ?   Tenderness: There is no abdominal tenderness.  ?Musculoskeletal:     ?   General: Normal range of motion.  ?   Cervical back: Full passive range of motion without pain and normal range of motion.  ?Skin: ?   General: Skin is warm and dry.  ?Neurological:  ?   General: No focal deficit present.  ?   Mental Status: She is alert.  ?Psychiatric:     ?   Mood and Affect: Mood normal.  ? ? ?ED Results / Procedures / Treatments   ?Labs ?(all labs ordered are listed, but only abnormal  results are displayed) ?Labs Reviewed  ?GROUP A STREP BY PCR  ? ? ?EKG ?None ? ?Radiology ?No results found. ? ?Procedures ?Procedures  ? ? ?Medications Ordered in ED ?Medications - No data to display ? ?ED Course/ Medical Decision Making/ A&P ?  ? ?26 year old here for evaluation of sore throat.  Afebrile, nonseptic, not ill-appearing.  Patient has posterior oropharyngeal erythema and exudate.  Uvula midline.  No evidence of PTA or RPA.  Tolerating secretions.  No concern for intraoral STD.  Abdomen soft, nontender.  No neck stiffness or neck rigidity. ? ?Labs personally viewed interpreted: ? ?Strep negative ? ?Clinical suspicion for tonsillitis versus viral pharyngitis.  Treated symptomatically. ? ?We will have her follow-up outpatient.  Initially Rx written for Magic mouthwash, discontinued due to pregnancy.  No concerns with pregnancy, abdominal pain, vaginal bleeding, fluid leakage. Tylenol as needed for pain. ? ?The patient has been appropriately medically screened and/or stabilized in the ED. I have low suspicion for any other emergent medical condition which would require further screening, evaluation or treatment in the ED or require inpatient management. ? ?Patient is hemodynamically stable and in no acute distress.  Patient able to ambulate in department prior to ED.  Evaluation does not show acute pathology that would require ongoing or additional emergent interventions while in the emergency department or further inpatient treatment.  I have discussed the diagnosis with the patient and answered all questions.  Pain is been managed while in the emergency department and patient has no further complaints prior to discharge.  Patient is comfortable with plan discussed in room and is stable for discharge at this time.  I have discussed strict return precautions for returning to the emergency department.  Patient was encouraged to follow-up with PCP/specialist refer to at discharge.  ? ?                         ?Medical Decision Making ?Amount and/or Complexity of Data Reviewed ?External Data Reviewed: labs, radiology and notes. ?Labs: ordered. Decision-making details documented in ED Course. ? ?Risk ?OTC drugs. ?Prescription drug management. ? ? ? ? ? ? ? ? ? ?Final Clinical Impression(s) / ED Diagnoses ?Final diagnoses:  ?Tonsillitis  ? ? ?Rx / DC Orders ?ED Discharge Orders   ? ?      Ordered  ?  clindamycin (CLEOCIN) 300 MG capsule  3 times daily       ? 11/17/21 2010  ?  magic mouthwash (lidocaine, diphenhydrAMINE, alum & mag hydroxide) suspension  3 times daily PRN,   Status:  Discontinued       ?  11/17/21 2015  ? ?  ?  ? ?  ? ? ?  ?Aanshi Batchelder A, PA-C ?11/17/21 2019 ? ?  ?Cheryll Cockayne, MD ?11/24/21 2007 ? ?

## 2021-12-22 ENCOUNTER — Other Ambulatory Visit: Payer: Self-pay

## 2021-12-22 ENCOUNTER — Inpatient Hospital Stay (HOSPITAL_COMMUNITY)
Admission: AD | Admit: 2021-12-22 | Discharge: 2021-12-22 | Disposition: A | Discharge status: Home / Self Care | Payer: Medicaid Other | Attending: Obstetrics and Gynecology | Admitting: Obstetrics and Gynecology

## 2021-12-22 ENCOUNTER — Encounter (HOSPITAL_COMMUNITY): Payer: Self-pay | Admitting: Obstetrics and Gynecology

## 2021-12-22 DIAGNOSIS — O99323 Drug use complicating pregnancy, third trimester: Secondary | ICD-10-CM | POA: Insufficient documentation

## 2021-12-22 DIAGNOSIS — M7918 Myalgia, other site: Secondary | ICD-10-CM | POA: Diagnosis not present

## 2021-12-22 DIAGNOSIS — Z3A28 28 weeks gestation of pregnancy: Secondary | ICD-10-CM | POA: Diagnosis not present

## 2021-12-22 DIAGNOSIS — O99891 Other specified diseases and conditions complicating pregnancy: Secondary | ICD-10-CM | POA: Diagnosis not present

## 2021-12-22 LAB — URINALYSIS, ROUTINE W REFLEX MICROSCOPIC
Bilirubin Urine: NEGATIVE
Glucose, UA: NEGATIVE mg/dL
Hgb urine dipstick: NEGATIVE
Ketones, ur: NEGATIVE mg/dL
Nitrite: NEGATIVE
Protein, ur: NEGATIVE mg/dL
Specific Gravity, Urine: 1.025 (ref 1.005–1.030)
pH: 7 (ref 5.0–8.0)

## 2021-12-22 MED ORDER — CYCLOBENZAPRINE HCL 10 MG PO TABS: 10.0000 mg | ORAL_TABLET | Freq: Three times a day (TID) | ORAL | 0 refills | Status: DC | PRN

## 2021-12-22 NOTE — MAU Provider Note (Signed)
?History  ?  ? ?CSN: [id number] ? ?Arrival date and time: 12/22/21 1826 ? ? Event Date/Time  ? First Provider Initiated Contact with Patient 12/22/21 1919   ?  ? ?Chief Complaint  ?Patient presents with  ? Hip Pain  ? ?26 year old G6 P2-0-3-2 at 28.2 weeks presenting with right hip pain and tailbone pain.  Pain started about 1 month ago and has become worse.  Rates pain 7/10.  Has treated with Tylenol but got no relief.  Denies any strenuous activity or injury.  No heavy lifting other than her 22 pound baby.  Pain is worse with standing after sitting for period of time. Also worse with walking. Denies vaginal bleeding, LOF, vaginal discharge, contractions.  Denies urinary symptoms.  Reports good fetal movement.  States her pregnancy has been uncomplicated. ? ? ?OB History   ? ? Gravida  ?6  ? Para  ?2  ? Term  ?2  ? Preterm  ?   ? AB  ?3  ? Living  ?2  ?  ? ? SAB  ?1  ? IAB  ?2  ? Ectopic  ?   ? Multiple  ?0  ? Live Births  ?2  ?   ?  ?  ? ? ?Past Medical History:  ?Diagnosis Date  ? Allergy   ? Anxiety   ? Headache   ? Knee fracture 2015  ? Seasonal allergies   ? ? ?Past Surgical History:  ?Procedure Laterality Date  ? I & D EXTREMITY Left 03/04/2014  ? Procedure: IRRIGATION AND DEBRIDEMENT KNEE ARTHROTOMY WITH REMOVAL OF BULLET FRAGMENTS;  Surgeon: Eldred MangesMark C Yates, MD;  Location: [facility] OR;  Service: Orthopedics;  Laterality: Left;  ? KNEE SURGERY    ? ? ?Family History  ?Problem Relation Age of Onset  ? Dementia Maternal Grandmother   ? Emphysema Paternal Grandmother   ? COPD Paternal Grandmother   ? ? ?Social History  ? ?Tobacco Use  ? Smoking status: Former  ?  Packs/day: 0.50  ?  Types: Cigarettes  ? Smokeless tobacco: Never  ?Vaping Use  ? Vaping Use: Never used  ?Substance Use Topics  ? Alcohol use: Not Currently  ? Drug use: Not Currently  ?  Types: Marijuana  ?  Comment: occ  ? ? ?Allergies:  ?Allergies  ?Allergen Reactions  ? Orange Fruit [Citrus] Swelling  ?  Swelling around the lips  ? Penicillins Hives  ?  Has  patient had a PCN reaction causing immediate rash, facial/tongue/throat swelling, SOB or lightheadedness with hypotension: No ?Has patient had a PCN reaction causing severe rash involving mucus membranes or skin necrosis: No ?Has patient had a PCN reaction that required hospitalization No ?Has patient had a PCN reaction occurring within the last 10 years: Yes ?If all of the above answers are "NO", then may proceed with Cephalosporin use. ?  ? Tomato Swelling  ?  Swelling around the lips  ? ? ?Medications Prior to Admission  ?Medication Sig Dispense Refill Last Dose  ? acetaminophen (TYLENOL) 325 MG tablet Take 2 tablets (650 mg total) by mouth every 4 (four) hours as needed (for pain scale < 4). 30 tablet 0 12/21/2021  ? fluticasone (FLONASE) 50 MCG/ACT nasal spray Place 1 spray into both nostrils in the morning and at bedtime. 9.9 mL 2   ? methocarbamol (ROBAXIN) 500 MG tablet Take 1 tablet (500 mg total) by mouth 2 (two) times daily. 20 tablet 0   ? prenatal vitamin w/FE, FA (NATACHEW)  29-1 MG CHEW chewable tablet Chew 1 tablet by mouth daily at 12 noon.   More than a month  ? ? ?Review of Systems  ?Gastrointestinal:  Negative for abdominal pain, constipation, diarrhea, nausea and vomiting.  ?Genitourinary:  Negative for dysuria, hematuria, urgency, vaginal bleeding and vaginal discharge.  ?Musculoskeletal:  Positive for arthralgias.  ?Physical Exam  ? ?Blood pressure 108/62, pulse 83, temperature 98.4 ?F (36.9 ?C), temperature source Oral, resp. rate 16, height 5\' 6"  (1.676 m), weight 69.7 kg, last menstrual period 06/07/2021, SpO2 100 %, unknown if currently breastfeeding. ? ?Physical Exam ?Vitals and nursing note reviewed. Exam conducted with a chaperone present.  ?Constitutional:   ?   General: She is not in acute distress (appears comfortable). ?   Appearance: Normal appearance.  ?HENT:  ?   Head: Normocephalic and atraumatic.  ?Cardiovascular:  ?   Rate and Rhythm: Normal rate.  ?Pulmonary:  ?   Effort:  Pulmonary effort is normal. No respiratory distress.  ?Abdominal:  ?   Palpations: Abdomen is soft.  ?   Tenderness: There is no abdominal tenderness.  ?   Comments: gravid  ?Genitourinary: ?   Comments: VE: closed/thick ?Musculoskeletal:     ?   General: Normal range of motion.  ?   Cervical back: Normal range of motion.  ?   Comments: Pain worse with rt hip flexion  ?Skin: ?   General: Skin is warm and dry.  ?Neurological:  ?   General: No focal deficit present.  ?   Mental Status: She is alert and oriented to person, place, and time.  ?Psychiatric:     ?   Mood and Affect: Mood normal.     ?   Behavior: Behavior normal.  ?EFM: 150 bpm, mod variability, + accels, rare variable decel ?Toco: none ? ?Results for orders placed or performed during the hospital encounter of 12/22/21 (from the past 24 hour(s))  ?Urinalysis, Routine w reflex microscopic Urine, Clean Catch     Status: Abnormal  ? Collection Time: 12/22/21  6:57 PM  ?Result Value Ref Range  ? Color, Urine YELLOW YELLOW  ? APPearance HAZY (A) CLEAR  ? Specific Gravity, Urine 1.025 1.005 - 1.030  ? pH 7.0 5.0 - 8.0  ? Glucose, UA NEGATIVE NEGATIVE mg/dL  ? Hgb urine dipstick NEGATIVE NEGATIVE  ? Bilirubin Urine NEGATIVE NEGATIVE  ? Ketones, ur NEGATIVE NEGATIVE mg/dL  ? Protein, ur NEGATIVE NEGATIVE mg/dL  ? Nitrite NEGATIVE NEGATIVE  ? Leukocytes,Ua LARGE (A) NEGATIVE  ? RBC / HPF 6-10 0 - 5 RBC/hpf  ? WBC, UA 11-20 0 - 5 WBC/hpf  ? Bacteria, UA RARE (A) NONE SEEN  ? Squamous Epithelial / LPF 6-10 0 - 5  ? Mucus PRESENT   ? Non Squamous Epithelial 0-5 (A) NONE SEEN  ? ?MAU Course  ?Procedures ? ?MDM ?No prenatal records on file.  Labs ordered and reviewed.  Offered muscle relaxer but pt declines due to driving herself. No signs of preterm labor.  Symptoms consistent with MSK.  Discussed comfort measures.  Stable for discharge. ? ?Assessment and Plan  ? ?1. [redacted] weeks gestation of pregnancy   ?2. Musculoskeletal pain   ? ?Discharge home ?Follow up at [facility] next week as scheduled ?Return precautions ? ?Allergies as of 12/22/2021   ? ?   Reactions  ? Orange Fruit [citrus] Swelling  ? Swelling around the lips  ? Penicillins Hives  ? Has patient had a PCN reaction causing  immediate rash, facial/tongue/throat swelling, SOB or lightheadedness with hypotension: No ?Has patient had a PCN reaction causing severe rash involving mucus membranes or skin necrosis: No ?Has patient had a PCN reaction that required hospitalization No ?Has patient had a PCN reaction occurring within the last 10 years: Yes ?If all of the above answers are "NO", then may proceed with Cephalosporin use.  ? Tomato Swelling  ? Swelling around the lips  ? ?  ? ?  ?Medication List  ?  ? ?STOP taking these medications   ? ?methocarbamol 500 MG tablet ?Commonly known as: ROBAXIN ?  ? ?  ? ?TAKE these medications   ? ?acetaminophen 325 MG tablet ?Commonly known as: Tylenol ?Take 2 tablets (650 mg total) by mouth every 4 (four) hours as needed (for pain scale < 4). ?  ?cyclobenzaprine 10 MG tablet ?Commonly known as: FLEXERIL ?Take 1 tablet (10 mg total) by mouth 3 (three) times daily as needed. ?  ?fluticasone 50 MCG/ACT nasal spray ?Commonly known as: Flonase ?Place 1 spray into both nostrils in the morning and at bedtime. ?  ?prenatal vitamin w/FE, FA 29-1 MG Chew chewable tablet ?Chew 1 tablet by mouth daily at 12 noon. ?  ? ?  ? ?Donette Larry, CNM ?12/22/2021, 7:40 PM  ?

## 2021-12-22 NOTE — MAU Note (Signed)
Dawn Proctor is a 26 y.o. at [redacted]w[redacted]d here in MAU reporting: pain in her hips and bottom for the past month. States it is constant. It has gotten more consistent over the past month. No bleeding or LOF. +FM ? ?Onset of complaint: ongoing ? ?Pain score: 8/10 ? ?Vitals:  ? 12/22/21 1846  ?BP: 105/60  ?Pulse: 97  ?Resp: 16  ?Temp: 98.4 ?F (36.9 ?C)  ?SpO2: 99%  ?   ?FHT:157 ? ?Lab orders placed from triage: UA ? ?

## 2021-12-23 LAB — CULTURE, OB URINE

## 2021-12-29 LAB — OB RESULTS CONSOLE RPR: RPR: NONREACTIVE

## 2022-02-13 ENCOUNTER — Encounter (HOSPITAL_COMMUNITY): Payer: Self-pay | Admitting: Obstetrics and Gynecology

## 2022-02-13 ENCOUNTER — Inpatient Hospital Stay (HOSPITAL_COMMUNITY)
Admission: AD | Admit: 2022-02-13 | Discharge: 2022-02-16 | DRG: 806 | Disposition: A | Discharge status: Home / Self Care | Payer: BC Managed Care – PPO | Attending: Obstetrics and Gynecology | Admitting: Obstetrics and Gynecology

## 2022-02-13 DIAGNOSIS — Z87891 Personal history of nicotine dependence: Secondary | ICD-10-CM

## 2022-02-13 DIAGNOSIS — Z3A36 36 weeks gestation of pregnancy: Secondary | ICD-10-CM

## 2022-02-13 DIAGNOSIS — D62 Acute posthemorrhagic anemia: Secondary | ICD-10-CM | POA: Diagnosis not present

## 2022-02-13 DIAGNOSIS — O9081 Anemia of the puerperium: Secondary | ICD-10-CM | POA: Diagnosis not present

## 2022-02-13 DIAGNOSIS — Z880 Allergy status to penicillin: Secondary | ICD-10-CM

## 2022-02-13 DIAGNOSIS — O9832 Other infections with a predominantly sexual mode of transmission complicating childbirth: Secondary | ICD-10-CM | POA: Diagnosis present

## 2022-02-13 DIAGNOSIS — A5901 Trichomonal vulvovaginitis: Secondary | ICD-10-CM | POA: Diagnosis present

## 2022-02-13 DIAGNOSIS — O99824 Streptococcus B carrier state complicating childbirth: Secondary | ICD-10-CM | POA: Diagnosis present

## 2022-02-13 NOTE — MAU Provider Note (Signed)
Event Date/Time   First Provider Initiated Contact with Patient 02/13/22 2353       S: [name] is a 26 y.o. [id number] at [redacted]w[redacted]d  who presents to MAU today complaining of contraction for the past 2 hours. She denies vaginal bleeding. She denies LOF. She reports normal fetal movement.    O: BP 114/72   Pulse 74   Temp 97.8 F (36.6 C) (Oral)   Resp 18   Ht 5\' 6"  (1.676 m)   Wt 74.3 kg   LMP 06/07/2021   SpO2 98%   BMI 26.42 kg/m  GENERAL: Well-developed, well-nourished female in no acute distress.  HEAD: Normocephalic, atraumatic.  CHEST: Normal effort of breathing, regular heart rate ABDOMEN: Soft, nontender, gravid  Cervical exam:  Dilation: 3.5 Effacement (%): 50 Station: -3 Presentation: Vertex Exam by:: [email or web address], CNM   Fetal Monitoring: Baseline: 150 Variability: Moderate Accelerations: Present 15x15 Decelerations: One variable noted Contractions: Q1-19min   A: SIUP at [redacted]w[redacted]d  Contractions Cat I FT   P: Cervix with some change from office exam of 2cm. Will monitor for one hour and reassess. Patient declines pain medication at current. Encouraged to notify staff if pain worsens or SROM occurs before that time.   [redacted]w[redacted]d, CNM 02/13/2022 11:53 PM  Reassessment (12:52 AM)  -Cervical change noted. -Nurse instructed to contact primary ob for further instruction. -NST remains reactive. Ctx remain every 1-2 min.  02/15/2022 MSN, CNM Advanced Practice Provider[facility]

## 2022-02-13 NOTE — MAU Note (Signed)
Pt says UC strong  x45 min Dr Reina Fuse - VE- 1-2 cm on Wed   Denies HSV GBS_ not collected

## 2022-02-14 ENCOUNTER — Inpatient Hospital Stay (HOSPITAL_COMMUNITY): Payer: BC Managed Care – PPO | Admitting: Anesthesiology

## 2022-02-14 ENCOUNTER — Other Ambulatory Visit: Payer: Self-pay

## 2022-02-14 ENCOUNTER — Encounter (HOSPITAL_COMMUNITY): Payer: Self-pay | Admitting: Obstetrics and Gynecology

## 2022-02-14 DIAGNOSIS — O99824 Streptococcus B carrier state complicating childbirth: Secondary | ICD-10-CM | POA: Diagnosis present

## 2022-02-14 DIAGNOSIS — Z87891 Personal history of nicotine dependence: Secondary | ICD-10-CM | POA: Diagnosis not present

## 2022-02-14 DIAGNOSIS — Z3A36 36 weeks gestation of pregnancy: Secondary | ICD-10-CM | POA: Diagnosis not present

## 2022-02-14 DIAGNOSIS — Z880 Allergy status to penicillin: Secondary | ICD-10-CM | POA: Diagnosis not present

## 2022-02-14 DIAGNOSIS — D62 Acute posthemorrhagic anemia: Secondary | ICD-10-CM | POA: Diagnosis not present

## 2022-02-14 DIAGNOSIS — O9081 Anemia of the puerperium: Secondary | ICD-10-CM | POA: Diagnosis not present

## 2022-02-14 DIAGNOSIS — A5901 Trichomonal vulvovaginitis: Secondary | ICD-10-CM | POA: Diagnosis present

## 2022-02-14 DIAGNOSIS — O9832 Other infections with a predominantly sexual mode of transmission complicating childbirth: Secondary | ICD-10-CM | POA: Diagnosis present

## 2022-02-14 LAB — WET PREP, GENITAL
Clue Cells Wet Prep HPF POC: NONE SEEN
Sperm: NONE SEEN
Trich, Wet Prep: NONE SEEN
WBC, Wet Prep HPF POC: >=10 — AB (ref <10)
Yeast Wet Prep HPF POC: NONE SEEN

## 2022-02-14 LAB — TYPE AND SCREEN
ABO/RH(D): O POS
Antibody Screen: NEGATIVE

## 2022-02-14 LAB — CBC
HCT: 32.8 % — ABNORMAL LOW (ref 36.0–46.0)
Hemoglobin: 10.1 g/dL — ABNORMAL LOW (ref 12.0–15.0)
MCH: 22.7 pg — ABNORMAL LOW (ref 26.0–34.0)
MCHC: 30.8 g/dL (ref 30.0–36.0)
MCV: 73.9 fL — ABNORMAL LOW (ref 80.0–100.0)
Platelets: 418 10*3/uL — ABNORMAL HIGH (ref 150–400)
RBC: 4.44 MIL/uL (ref 3.87–5.11)
RDW: 14.3 % (ref 11.5–15.5)
WBC: 7.9 10*3/uL (ref 4.0–10.5)
nRBC: 0 % (ref 0.0–0.2)

## 2022-02-14 LAB — GROUP B STREP BY PCR: Group B strep by PCR: POSITIVE — AB

## 2022-02-14 LAB — RPR: RPR Ser Ql: NONREACTIVE

## 2022-02-14 LAB — HIV ANTIBODY (ROUTINE TESTING W REFLEX): HIV Screen 4th Generation wRfx: NONREACTIVE

## 2022-02-14 LAB — HEPATITIS B SURFACE ANTIGEN: Hepatitis B Surface Ag: NONREACTIVE

## 2022-02-14 MED ORDER — ONDANSETRON HCL 4 MG PO TABS: 4.0000 mg | ORAL_TABLET | ORAL | Status: DC | PRN

## 2022-02-14 MED ORDER — FENTANYL CITRATE (PF) 100 MCG/2ML IJ SOLN
50.0000 ug | INTRAMUSCULAR | Status: DC | PRN
  Administered 2022-02-14: 50 ug via INTRAVENOUS
  Filled 2022-02-14: qty 2

## 2022-02-14 MED ORDER — WITCH HAZEL-GLYCERIN EX PADS: 1.0000 | MEDICATED_PAD | CUTANEOUS | Status: DC | PRN

## 2022-02-14 MED ORDER — OXYTOCIN-SODIUM CHLORIDE 30-0.9 UT/500ML-% IV SOLN
2.5000 [IU]/h | INTRAVENOUS | Status: DC
  Filled 2022-02-14: qty 500

## 2022-02-14 MED ORDER — BISACODYL 10 MG RE SUPP: 10.0000 mg | Freq: Every day | RECTAL | Status: DC | PRN

## 2022-02-14 MED ORDER — OXYCODONE HCL 5 MG PO TABS: 10.0000 mg | ORAL_TABLET | ORAL | Status: DC | PRN

## 2022-02-14 MED ORDER — ONDANSETRON HCL 4 MG/2ML IJ SOLN: 4.0000 mg | INTRAMUSCULAR | Status: DC | PRN

## 2022-02-14 MED ORDER — EPHEDRINE 5 MG/ML INJ: 10.0000 mg | INTRAVENOUS | Status: DC | PRN

## 2022-02-14 MED ORDER — ACETAMINOPHEN 325 MG PO TABS
650.0000 mg | ORAL_TABLET | ORAL | Status: DC | PRN
Start: 2022-02-14 — End: 2022-02-14

## 2022-02-14 MED ORDER — LIDOCAINE HCL (PF) 1 % IJ SOLN: 30.0000 mL | INTRAMUSCULAR | Status: DC | PRN

## 2022-02-14 MED ORDER — OXYTOCIN-SODIUM CHLORIDE 30-0.9 UT/500ML-% IV SOLN
1.0000 m[IU]/min | INTRAVENOUS | Status: DC
  Administered 2022-02-14: 2 m[IU]/min via INTRAVENOUS

## 2022-02-14 MED ORDER — LACTATED RINGERS IV SOLN
500.0000 mL | Freq: Once | INTRAVENOUS | Status: AC
  Administered 2022-02-14: 500 mL via INTRAVENOUS

## 2022-02-14 MED ORDER — ONDANSETRON HCL 4 MG/2ML IJ SOLN
4.0000 mg | Freq: Four times a day (QID) | INTRAMUSCULAR | Status: DC | PRN
  Administered 2022-02-14: 4 mg via INTRAVENOUS
  Filled 2022-02-14: qty 2

## 2022-02-14 MED ORDER — BENZOCAINE-MENTHOL 20-0.5 % EX AERO: 1.0000 | INHALATION_SPRAY | CUTANEOUS | Status: DC | PRN

## 2022-02-14 MED ORDER — LIDOCAINE HCL (PF) 1 % IJ SOLN
INTRAMUSCULAR | Status: DC | PRN
  Administered 2022-02-14 (×2): 5 mL via EPIDURAL

## 2022-02-14 MED ORDER — ACETAMINOPHEN 325 MG PO TABS
650.0000 mg | ORAL_TABLET | ORAL | Status: DC | PRN
  Administered 2022-02-14 – 2022-02-15 (×2): 650 mg via ORAL
  Filled 2022-02-14 (×2): qty 2

## 2022-02-14 MED ORDER — COCONUT OIL OIL: 1.0000 | TOPICAL_OIL | Status: DC | PRN

## 2022-02-14 MED ORDER — CLINDAMYCIN PHOSPHATE 900 MG/50ML IV SOLN: 900.0000 mg | Freq: Once | INTRAVENOUS | Status: DC

## 2022-02-14 MED ORDER — OXYCODONE-ACETAMINOPHEN 5-325 MG PO TABS: 2.0000 | ORAL_TABLET | ORAL | Status: DC | PRN

## 2022-02-14 MED ORDER — OXYCODONE HCL 5 MG PO TABS: 5.0000 mg | ORAL_TABLET | ORAL | Status: DC | PRN

## 2022-02-14 MED ORDER — PHENYLEPHRINE 80 MCG/ML (10ML) SYRINGE FOR IV PUSH (FOR BLOOD PRESSURE SUPPORT): 80.0000 ug | PREFILLED_SYRINGE | INTRAVENOUS | Status: DC | PRN

## 2022-02-14 MED ORDER — LACTATED RINGERS IV SOLN: INTRAVENOUS | Status: DC

## 2022-02-14 MED ORDER — VANCOMYCIN HCL IN DEXTROSE 1-5 GM/200ML-% IV SOLN
1000.0000 mg | Freq: Two times a day (BID) | INTRAVENOUS | Status: DC
  Administered 2022-02-14: 1000 mg via INTRAVENOUS
  Filled 2022-02-14: qty 200

## 2022-02-14 MED ORDER — DIPHENHYDRAMINE HCL 25 MG PO CAPS: 25.0000 mg | ORAL_CAPSULE | Freq: Four times a day (QID) | ORAL | Status: DC | PRN

## 2022-02-14 MED ORDER — METRONIDAZOLE 500 MG PO TABS
500.0000 mg | ORAL_TABLET | Freq: Two times a day (BID) | ORAL | Status: DC
  Administered 2022-02-15 – 2022-02-16 (×3): 500 mg via ORAL
  Filled 2022-02-14 (×5): qty 1

## 2022-02-14 MED ORDER — SOD CITRATE-CITRIC ACID 500-334 MG/5ML PO SOLN: 30.0000 mL | ORAL | Status: DC | PRN

## 2022-02-14 MED ORDER — FLEET ENEMA 7-19 GM/118ML RE ENEM: 1.0000 | ENEMA | Freq: Every day | RECTAL | Status: DC | PRN

## 2022-02-14 MED ORDER — TETANUS-DIPHTH-ACELL PERTUSSIS 5-2.5-18.5 LF-MCG/0.5 IM SUSY: 0.5000 mL | PREFILLED_SYRINGE | Freq: Once | INTRAMUSCULAR | Status: DC

## 2022-02-14 MED ORDER — PHENYLEPHRINE 80 MCG/ML (10ML) SYRINGE FOR IV PUSH (FOR BLOOD PRESSURE SUPPORT)
80.0000 ug | PREFILLED_SYRINGE | INTRAVENOUS | Status: DC | PRN
  Filled 2022-02-14: qty 10

## 2022-02-14 MED ORDER — DIBUCAINE (PERIANAL) 1 % EX OINT: 1.0000 | TOPICAL_OINTMENT | CUTANEOUS | Status: DC | PRN

## 2022-02-14 MED ORDER — FENTANYL-BUPIVACAINE-NACL 0.5-0.125-0.9 MG/250ML-% EP SOLN
12.0000 mL/h | EPIDURAL | Status: DC | PRN
  Administered 2022-02-14: 12 mL/h via EPIDURAL
  Filled 2022-02-14: qty 250

## 2022-02-14 MED ORDER — FENTANYL CITRATE (PF) 100 MCG/2ML IJ SOLN
50.0000 ug | Freq: Once | INTRAMUSCULAR | Status: AC
  Administered 2022-02-14: 50 ug via INTRAVENOUS
  Filled 2022-02-14: qty 2

## 2022-02-14 MED ORDER — TERBUTALINE SULFATE 1 MG/ML IJ SOLN: 0.2500 mg | Freq: Once | INTRAMUSCULAR | Status: DC | PRN

## 2022-02-14 MED ORDER — OXYCODONE-ACETAMINOPHEN 5-325 MG PO TABS: 1.0000 | ORAL_TABLET | ORAL | Status: DC | PRN

## 2022-02-14 MED ORDER — DOCUSATE SODIUM 100 MG PO CAPS
100.0000 mg | ORAL_CAPSULE | Freq: Two times a day (BID) | ORAL | Status: DC
  Administered 2022-02-15 – 2022-02-16 (×3): 100 mg via ORAL
  Filled 2022-02-14 (×3): qty 1

## 2022-02-14 MED ORDER — DIPHENHYDRAMINE HCL 50 MG/ML IJ SOLN: 12.5000 mg | INTRAMUSCULAR | Status: DC | PRN

## 2022-02-14 MED ORDER — IBUPROFEN 600 MG PO TABS
600.0000 mg | ORAL_TABLET | Freq: Four times a day (QID) | ORAL | Status: DC
  Administered 2022-02-14 – 2022-02-16 (×7): 600 mg via ORAL
  Filled 2022-02-14 (×8): qty 1

## 2022-02-14 MED ORDER — OXYTOCIN BOLUS FROM INFUSION
333.0000 mL | Freq: Once | INTRAVENOUS | Status: AC
  Administered 2022-02-14: 333 mL via INTRAVENOUS

## 2022-02-14 MED ORDER — SIMETHICONE 80 MG PO CHEW: 80.0000 mg | CHEWABLE_TABLET | ORAL | Status: DC | PRN

## 2022-02-14 MED ORDER — LACTATED RINGERS IV SOLN: 500.0000 mL | INTRAVENOUS | Status: DC | PRN

## 2022-02-14 MED ORDER — PRENATAL MULTIVITAMIN CH
1.0000 | ORAL_TABLET | Freq: Every day | ORAL | Status: DC
  Filled 2022-02-14 (×2): qty 1

## 2022-02-14 NOTE — Anesthesia Procedure Notes (Signed)
Epidural Patient location during procedure: OB Start time: 02/14/2022 3:18 AM End time: 02/14/2022 3:29 AM  Staffing Anesthesiologist: Lucretia Kern, MD Performed: anesthesiologist   Preanesthetic Checklist Completed: patient identified, IV checked, risks and benefits discussed, monitors and equipment checked, pre-op evaluation and timeout performed  Epidural Patient position: sitting Prep: DuraPrep Patient monitoring: heart rate, continuous pulse ox and blood pressure Approach: midline Location: L3-L4 Injection technique: LOR air  Needle:  Needle type: Tuohy  Needle gauge: 17 G Needle length: 9 cm Needle insertion depth: 5 cm Catheter type: closed end flexible Catheter size: 19 Gauge Catheter at skin depth: 10 cm Test dose: negative  Assessment Events: blood not aspirated, injection not painful, no injection resistance, no paresthesia and negative IV test  Additional Notes Reason for block:procedure for pain

## 2022-02-14 NOTE — Progress Notes (Signed)
OB Progress Note  S: comfortable with epidural   O: Today's Vitals   02/14/22 0600 02/14/22 0630 02/14/22 0700 02/14/22 0730  BP: 107/69 112/71 (!) 105/58 105/70  Pulse: 60 66 70 67  Resp: 16 16 16 16   Temp:      TempSrc:      SpO2:      Weight:      Height:      PainSc:       Body mass index is 26.42 kg/m.  SVE 6/70/-1, AROM clear blood tinged fluid  FHR:  135bpm, mod variability + accels, no decels Toco: ctx q 2-4 mins   A/P: 26Y @ [redacted]w[redacted]d, active preterm labor Fetal wellbeing: cat I tracing Active preterm labor: progressing spontaneously, s/p AROM, anticipate SVD Pain control: epidural  M. [redacted]w[redacted]d, MD 02/14/22 8:05 AM

## 2022-02-14 NOTE — Progress Notes (Signed)
Call from [facility] MD - patient's office vaginitis swab positive for trich. Will start PO flagyl course.   Alinda Deem, MD 02/14/22 10:09 PM

## 2022-02-14 NOTE — H&P (Signed)
Dawn Proctor is a 26 y.o. female presenting for contractions for past hour every 5 m. +FM< denies VB, LOF  PNC c/b +trich in early 2dn trimester and third trimesters, both treated appropriately.  OB History     Gravida  6   Para  2   Term  2   Preterm      AB  3   Living  2      SAB  1   IAB  2   Ectopic      Multiple  0   Live Births  2          Past Medical History:  Diagnosis Date   Allergy    Anxiety    Headache    Knee fracture 2015   Seasonal allergies    Past Surgical History:  Procedure Laterality Date   I & D EXTREMITY Left 03/04/2014   Procedure: IRRIGATION AND DEBRIDEMENT KNEE ARTHROTOMY WITH REMOVAL OF BULLET FRAGMENTS;  Surgeon: Eldred Manges, MD;  Location: [facility] OR;  Service: Orthopedics;  Laterality: Left;   KNEE SURGERY     Family History: family history includes COPD in her paternal grandmother; Dementia in her maternal grandmother; Emphysema in her paternal grandmother. Social History:  reports that she has quit smoking. Her smoking use included cigarettes. She smoked an average of .5 packs per day. She has never used smokeless tobacco. She reports that she does not currently use alcohol. She reports that she does not currently use drugs after having used the following drugs: Marijuana.     Maternal Diabetes: No Genetic Screening: Normal Maternal Ultrasounds/Referrals: Normal Fetal Ultrasounds or other Referrals:  None Maternal Substance Abuse:  No Significant Maternal Medications:  None Significant Maternal Lab Results:  Other:  Other Comments:  None  Review of Systems  Constitutional:  Negative for chills and fever.  Respiratory:  Negative for shortness of breath.   Cardiovascular:  Negative for chest pain, palpitations and leg swelling.  Gastrointestinal:  Negative for abdominal pain and vomiting.  Neurological:  Negative for dizziness, weakness and headaches.  Psychiatric/Behavioral:  Negative for suicidal ideas.    Maternal  Medical History:  Reason for admission: Contractions.   Contractions: Onset was 1-2 hours ago.   Frequency: regular.   Perceived severity is strong.   Fetal activity: Perceived fetal activity is normal.   Prenatal Complications - Diabetes: none.   Dilation: 5 Effacement (%): 60 Station: -3 Exam by:: Santiago Bur, RN Blood pressure 114/72, pulse 74, temperature 97.8 F (36.6 C), temperature source Oral, resp. rate 18, height 5\' 6"  (1.676 m), weight 74.3 kg, last menstrual period 06/07/2021, SpO2 98 %, unknown if currently breastfeeding. Maternal Exam:  Introitus: Vagina is positive for vaginal discharge (yellow green discharge).    Physical Exam Constitutional:      General: She is not in acute distress.    Appearance: She is well-developed.  HENT:     Head: Normocephalic and atraumatic.  Eyes:     Pupils: Pupils are equal, round, and reactive to light.  Cardiovascular:     Rate and Rhythm: Normal rate and regular rhythm.     Heart sounds: No murmur heard.    No gallop.  Abdominal:     Tenderness: There is no abdominal tenderness. There is no guarding or rebound.  Genitourinary:    Vagina: Vaginal discharge (yellow green discharge) present.  Musculoskeletal:        General: Normal range of motion.     Cervical back:  Normal range of motion and neck supple.  Skin:    General: Skin is warm and dry.  Neurological:     Mental Status: She is alert and oriented to person, place, and time.     Prenatal labs: ABO, Rh:  Opos Antibody:  neg Rubella:  not drawn RPR:   nr HBsAg:   not drawn HIV:   nr GBS:   pending  Cat 1 tracing, TOCO q2-75m  Assessment/Plan: This is a 26yo [id number] @ 36 0/7 presenting in active PTL. GBS collected and pending, will order IV clindamycin given PCN allergy. On review of prenatal labs, rubella and HepBsAg not drawn, will draw now. May have epidural, pelvis proven to 7lb6oz   Zakry Caso M Traylon Schimming 02/14/2022, 2:02 AM

## 2022-02-14 NOTE — Anesthesia Preprocedure Evaluation (Signed)
Anesthesia Evaluation  Patient identified by MRN, date of birth, ID band Patient awake    Reviewed: Allergy & Precautions, H&P , NPO status , Patient's Chart, lab work & pertinent test results  History of Anesthesia Complications Negative for: history of anesthetic complications  Airway Mallampati: II  TM Distance: >3 FB     Dental   Pulmonary neg pulmonary ROS, former smoker,    Pulmonary exam normal        Cardiovascular negative cardio ROS   Rhythm:regular Rate:Normal     Neuro/Psych negative neurological ROS  negative psych ROS   GI/Hepatic negative GI ROS, Neg liver ROS,   Endo/Other  negative endocrine ROS  Renal/GU negative Renal ROS  negative genitourinary   Musculoskeletal   Abdominal   Peds  Hematology  (+) Blood dyscrasia, anemia ,   Anesthesia Other Findings   Reproductive/Obstetrics (+) Pregnancy                             Anesthesia Physical Anesthesia Plan  ASA: 2  Anesthesia Plan: Epidural   Post-op Pain Management:    Induction:   PONV Risk Score and Plan:   Airway Management Planned:   Additional Equipment:   Intra-op Plan:   Post-operative Plan:   Informed Consent: I have reviewed the patients History and Physical, chart, labs and discussed the procedure including the risks, benefits and alternatives for the proposed anesthesia with the patient or authorized representative who has indicated his/her understanding and acceptance.       Plan Discussed with:   Anesthesia Plan Comments:         Anesthesia Quick Evaluation  

## 2022-02-15 LAB — CBC
HCT: 29.5 % — ABNORMAL LOW (ref 36.0–46.0)
Hemoglobin: 9 g/dL — ABNORMAL LOW (ref 12.0–15.0)
MCH: 22.9 pg — ABNORMAL LOW (ref 26.0–34.0)
MCHC: 30.5 g/dL (ref 30.0–36.0)
MCV: 75.1 fL — ABNORMAL LOW (ref 80.0–100.0)
Platelets: 365 10*3/uL (ref 150–400)
RBC: 3.93 MIL/uL (ref 3.87–5.11)
RDW: 14.3 % (ref 11.5–15.5)
WBC: 9.9 10*3/uL (ref 4.0–10.5)
nRBC: 0 % (ref 0.0–0.2)

## 2022-02-15 LAB — RUBELLA SCREEN: Rubella: 5.65 index (ref >0.99)

## 2022-02-15 MED ORDER — FERROUS SULFATE 325 (65 FE) MG PO TABS
325.0000 mg | ORAL_TABLET | Freq: Every day | ORAL | Status: DC
  Administered 2022-02-15 – 2022-02-16 (×2): 325 mg via ORAL
  Filled 2022-02-15 (×2): qty 1

## 2022-02-15 MED ORDER — FLUCONAZOLE 150 MG PO TABS
150.0000 mg | ORAL_TABLET | Freq: Once | ORAL | Status: AC
Start: 2022-02-15 — End: 2022-02-15
  Administered 2022-02-15: 150 mg via ORAL
  Filled 2022-02-15: qty 1

## 2022-02-15 NOTE — Anesthesia Postprocedure Evaluation (Signed)
Anesthesia Post Note  Patient: Dawn Proctor  Procedure(s) Performed: AN AD HOC LABOR EPIDURAL     Patient location during evaluation: Mother Baby Anesthesia Type: Epidural Level of consciousness: awake Pain management: satisfactory to patient Vital Signs Assessment: post-procedure vital signs reviewed and stable Respiratory status: spontaneous breathing Cardiovascular status: stable Anesthetic complications: no   No notable events documented.  Last Vitals:  Vitals:   02/15/22 0005 02/15/22 0410  BP: 110/67 102/72  Pulse: (!) 58 81  Resp: 16 18  Temp: 37 C 36.5 C  SpO2: 100% 100%    Last Pain:  Vitals:   02/15/22 0757  TempSrc:   PainSc: Asleep   Pain Goal:                   Cephus Shelling

## 2022-02-15 NOTE — Progress Notes (Signed)
MOB was referred for history of anxiety. * Referral screened out by Clinical Social Worker because none of the following criteria appear to apply: ~ History of anxiety/depression during this pregnancy, or of post-partum depression following prior delivery. ~ Diagnosis of anxiety and/or depression within last 3 years OR * MOB's symptoms currently being treated with medication and/or therapy.  MOB's anxiety diagnosis dates back to 2017. No current anxiety concerns in prenatal records. MOB had a score of 0 on Edinburgh Postpartum Depression screen 02/14/2022.  Please contact the Clinical Social Worker if needs arise, by [facility] request, or if MOB scores greater than 9/yes to question 10 on Edinburgh Postpartum Depression Screen.  Signed,  Norberto Sorenson, MSW, LCSWA, LCASA 02/15/2022 11:43 AM

## 2022-02-15 NOTE — Progress Notes (Addendum)
Post Partum Day 1 Subjective: Dawn Proctor is doing well this morning without complaints. Ambulating, voiding, tolerating PO. Minimal lochia. Formula feeding.   Objective: Patient Vitals for the past 24 hrs:  BP Temp Temp src Pulse Resp SpO2  02/15/22 0410 102/72 97.7 F (36.5 C) Oral 81 18 100 %  02/15/22 0005 110/67 98.6 F (37 C) Oral (!) 58 16 100 %  02/14/22 2022 111/70 98.4 F (36.9 C) Oral 81 16 100 %  02/14/22 1756 114/60 98.4 F (36.9 C) Oral 65 16 100 %  02/14/22 1622 116/73 98.1 F (36.7 C) Oral 72 16 --  02/14/22 1516 118/70 -- -- 67 16 --  02/14/22 1501 128/79 -- -- 70 16 --  02/14/22 1446 119/70 -- -- 63 16 --  02/14/22 1431 118/74 -- -- (!) 161 16 --  02/14/22 1416 111/72 -- -- (!) 118 16 --  02/14/22 1400 106/76 -- -- (!) 268 16 --  02/14/22 1330 129/77 -- -- 69 16 --  02/14/22 1310 -- 97.8 F (36.6 C) Oral -- 16 --  02/14/22 1230 108/76 -- -- 76 16 --  02/14/22 1214 108/70 -- -- 69 16 --  02/14/22 1130 114/75 -- -- 84 16 --  02/14/22 1100 111/65 -- -- 73 18 --  02/14/22 1030 121/76 -- -- 79 16 --  02/14/22 1000 119/72 98.1 F (36.7 C) Oral 63 16 --    Physical Exam:  General: alert, cooperative, and no distress Lochia: appropriate Uterine Fundus: firm DVT Evaluation: No evidence of DVT seen on physical exam.  Recent Labs    02/14/22 0205 02/15/22 0432  WBC 7.9 9.9  HGB 10.1* 9.0*  HCT 32.8* 29.5*  PLT 418* 365    No results for input(s): "NA", "K", "CL", "CO2CT", "BUN", "CREATININE", "GLUCOSE", "BILITOT", "ALT", "AST", "ALKPHOS", "PROT", "ALBUMIN" in the last 72 hours.  No results for input(s): "CALCIUM", "MG", "PHOS" in the last 72 hours.  No results for input(s): "PROTIME", "APTT", "INR" in the last 72 hours.  No results for input(s): "PROTIME", "APTT", "INR", "FIBRINOGEN" in the last 72 hours. Assessment/Plan:   Dawn Proctor 26 y.o. [id number] PPD#1 sp SVD at [redacted]w[redacted]d 1. PPC: routine PP care 2. Rh pos 3. ABLA: hgb 9.0, asymptomatic, PO iron  ordered 4. Trich + on 6/28 office swab: start flagyl 5. Dispo: given preterm and GBS+, peds planning to keep baby 48-72 hours for monitoring. Plan d/c tomorrow.    LOS: 1 day   Charlett Nose 02/15/2022, 9:59 AM

## 2022-02-16 MED ORDER — IBUPROFEN 600 MG PO TABS: 600.0000 mg | ORAL_TABLET | Freq: Four times a day (QID) | ORAL | 0 refills | Status: DC

## 2022-02-16 MED ORDER — METRONIDAZOLE 500 MG PO TABS: 500.0000 mg | ORAL_TABLET | Freq: Two times a day (BID) | ORAL | 0 refills | Status: DC

## 2022-02-16 NOTE — Progress Notes (Signed)
Patient is doing well.  She is ambulating, voiding, tolerating PO.  Pain control is good.  Lochia is appropriate  Vitals:   02/15/22 0005 02/15/22 0410 02/15/22 1455 02/15/22 2048  BP: 110/67 102/72 108/75 106/64  Pulse: (!) 58 81 69 77  Resp: 16 18 18 18   Temp: 98.6 F (37 C) 97.7 F (36.5 C) 97.8 F (36.6 C) (!) 97.3 F (36.3 C)  TempSrc: Oral Oral Oral Oral  SpO2: 100% 100% 100% 100%  Weight:      Height:        NAD Fundus firm Ext: trace edema bilaterally  Lab Results  Component Value Date   WBC 9.9 02/15/2022   HGB 9.0 (L) 02/15/2022   HCT 29.5 (L) 02/15/2022   MCV 75.1 (L) 02/15/2022   PLT 365 02/15/2022    --/--/O POS (07/01 0130)  A/P 26 y.o. 10-22-2006 PPD#2 s/p SVD at 36 weeks. Routine care.   +trichamonas in office--d/c with complete course of flagyl Meeting all goals.  Discharge to home today.   [facility]

## 2022-02-16 NOTE — Plan of Care (Signed)
Discharge instructions given with after visit summary, pt receptive to all teaching.

## 2022-02-16 NOTE — Discharge Summary (Signed)
Postpartum Discharge Summary  Date of Service updated 02/16/22     Patient Name: Dawn Proctor DOB: [birth year] MRN: [id number]  Date of admission: 02/13/2022 Delivery date:02/14/2022  Delivering provider: Derl Barrow E  Date of discharge: 02/16/2022  Admitting diagnosis: [redacted] weeks gestation of pregnancy [Z3A.36] Intrauterine pregnancy: [redacted]w[redacted]d     Secondary diagnosis:  Principal Problem:   [redacted] weeks gestation of pregnancy  Additional problems: trichamonas    Discharge diagnosis: Preterm Pregnancy Delivered                                              Post partum procedures: n/a Augmentation: AROM Complications: None  Hospital course: Onset of Labor With Vaginal Delivery      26 y.o. yo [id number] at [redacted]w[redacted]d was admitted in Active Labor on 02/13/2022. Patient had an uncomplicated labor course as follows:  Membrane Rupture Time/Date: 7:56 AM ,02/14/2022   Delivery Method:Vaginal, Spontaneous  Episiotomy: None  Lacerations:  None  Patient had an uncomplicated postpartum course other than a positive TV swab from the office and was started on a course of PO flagyl. She is ambulating, tolerating a regular diet, passing flatus, and urinating well. Patient is discharged home in stable condition on 02/16/22.  Newborn Data: Birth date:02/14/2022  Birth time:1:49 PM  Gender:Female  Living status:Living  Apgars:8 ,9  Weight:2665 g     Physical exam  Vitals:   02/15/22 0005 02/15/22 0410 02/15/22 1455 02/15/22 2048  BP: 110/67 102/72 108/75 106/64  Pulse: (!) 58 81 69 77  Resp: 16 18 18 18   Temp: 98.6 F (37 C) 97.7 F (36.5 C) 97.8 F (36.6 C) (!) 97.3 F (36.3 C)  TempSrc: Oral Oral Oral Oral  SpO2: 100% 100% 100% 100%  Weight:      Height:       General: alert, cooperative, and no distress Lochia: appropriate Uterine Fundus: firm Incision: N/A DVT Evaluation: No evidence of DVT seen on physical exam. Labs: Lab Results  Component Value Date   WBC 9.9 02/15/2022    HGB 9.0 (L) 02/15/2022   HCT 29.5 (L) 02/15/2022   MCV 75.1 (L) 02/15/2022   PLT 365 02/15/2022      Latest Ref Rng & Units 07/01/2021   12:20 PM  CMP  Glucose 70 - 99 mg/dL 99   BUN 6 - 20 mg/dL 10   Creatinine 07/03/2021 - 1.00 mg/dL 1.47   Sodium 8.29 - 562 mmol/L 134   Potassium 3.5 - 5.1 mmol/L 3.8   Chloride 98 - 111 mmol/L 104   CO2 22 - 32 mmol/L 23   Calcium 8.9 - 10.3 mg/dL 8.5   Total Protein 6.5 - 8.1 g/dL 6.8   Total Bilirubin 0.3 - 1.2 mg/dL 0.5   Alkaline Phos 38 - 126 U/L 50   AST 15 - 41 U/L 18   ALT 0 - 44 U/L 17    Edinburgh Score:    02/14/2022    4:40 PM  Edinburgh Postnatal Depression Scale Screening Tool  I have been able to laugh and see the funny side of things. 0  I have looked forward with enjoyment to things. 0  I have blamed myself unnecessarily when things went wrong. 0  I have been anxious or worried for no good reason. 0  I have felt scared or panicky for no good reason.  0  Things have been getting on top of me. 0  I have been so unhappy that I have had difficulty sleeping. 0  I have felt sad or miserable. 0  I have been so unhappy that I have been crying. 0  The thought of harming myself has occurred to me. 0  Edinburgh Postnatal Depression Scale Total 0      After visit meds:  Allergies as of 02/16/2022       Reactions   Orange Fruit [citrus] Swelling   Swelling around the lips   Penicillins Hives   Has patient had a PCN reaction causing immediate rash, facial/tongue/throat swelling, SOB or lightheadedness with hypotension: No Has patient had a PCN reaction causing severe rash involving mucus membranes or skin necrosis: No Has patient had a PCN reaction that required hospitalization No Has patient had a PCN reaction occurring within the last 10 years: Yes If all of the above answers are "NO", then may proceed with Cephalosporin use.   Tomato Swelling   Swelling around the lips        Medication List     STOP taking these  medications    cyclobenzaprine 10 MG tablet Commonly known as: FLEXERIL       TAKE these medications    acetaminophen 325 MG tablet Commonly known as: Tylenol Take 2 tablets (650 mg total) by mouth every 4 (four) hours as needed (for pain scale < 4).   fluticasone 50 MCG/ACT nasal spray Commonly known as: Flonase Place 1 spray into both nostrils in the morning and at bedtime.   ibuprofen 600 MG tablet Commonly known as: ADVIL Take 1 tablet (600 mg total) by mouth every 6 (six) hours.   metroNIDAZOLE 500 MG tablet Commonly known as: FLAGYL Take 1 tablet (500 mg total) by mouth 2 (two) times daily.   prenatal vitamin w/FE, FA 29-1 MG Chew chewable tablet Chew 1 tablet by mouth daily at 12 noon.         Discharge home in stable condition Infant Feeding: Bottle Infant Disposition:home with mother Discharge instruction: per After Visit Summary and Postpartum booklet. Activity: Advance as tolerated. Pelvic rest for 6 weeks.  Diet: routine diet Anticipated Birth Control: Unsure Postpartum Appointment:6 weeks Additional Postpartum F/U:  n/a Future Appointments:No future appointments. Follow up Visit:  Follow-up Information     [facility] Follow up in 6 week(s).   Contact information: [address]  [address] [place] [address] [phone]                     02/16/2022 [facility] GEFFEL Chestine Spore, MD

## 2022-02-18 LAB — SURGICAL PATHOLOGY

## 2022-02-19 ENCOUNTER — Encounter (HOSPITAL_COMMUNITY): Payer: Self-pay | Admitting: Obstetrics and Gynecology

## 2022-02-24 ENCOUNTER — Telehealth (HOSPITAL_COMMUNITY): Payer: Self-pay | Admitting: *Deleted

## 2022-02-24 NOTE — Telephone Encounter (Signed)
Mom reports feeling good. No concerns about herself at this time. Reports feeling well emotionally and has a great support system. [facility] epds score=0) Mom reports baby is doing well. Feeding, peeing, and pooping without difficulty. Safe sleep reviewed. Mom reports no concerns about baby at present.  Duffy Rhody, RN 02-24-2022 at 12:44pm

## 2022-04-21 IMAGING — US US OB < 14 WEEKS - US OB TV
1 series · 15 of 28 positions shown · non-contrast
Comparison: None.

CLINICAL DATA: Abdominal cramping. Possibly pregnant. Beta HCG
level is pending.

EXAM:
OBSTETRIC <14 WK US AND TRANSVAGINAL OB US
TECHNIQUE: Both transabdominal and transvaginal ultrasound examinations were
performed for complete evaluation of the gestation as well as the
maternal uterus, adnexal regions, and pelvic cul-de-sac.
Transvaginal technique was performed to assess early pregnancy.

[Series 1: us ob < 14 weeks - us ob tv · 62 acquisitions, 15 frames shown]
[im 1/62]
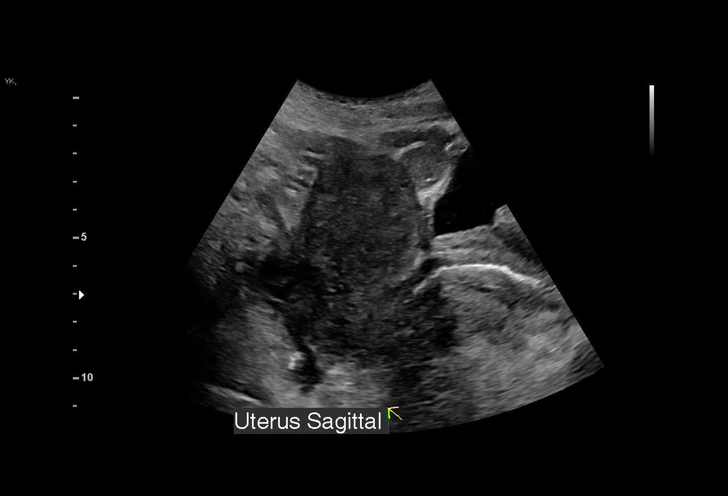
[im 5/62]
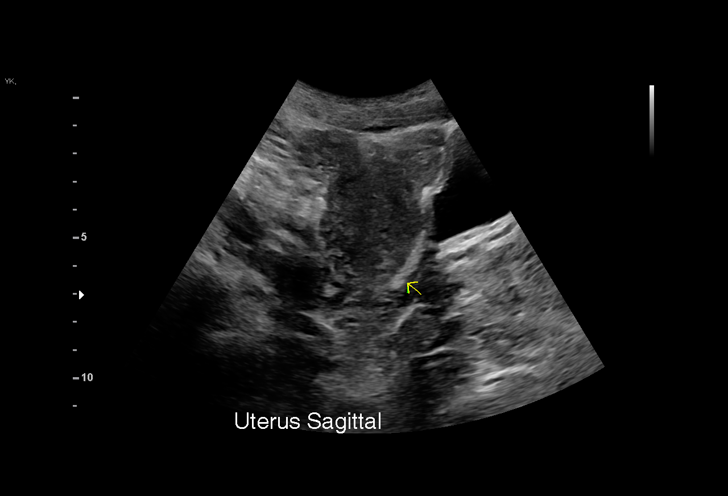
[im 10/62]
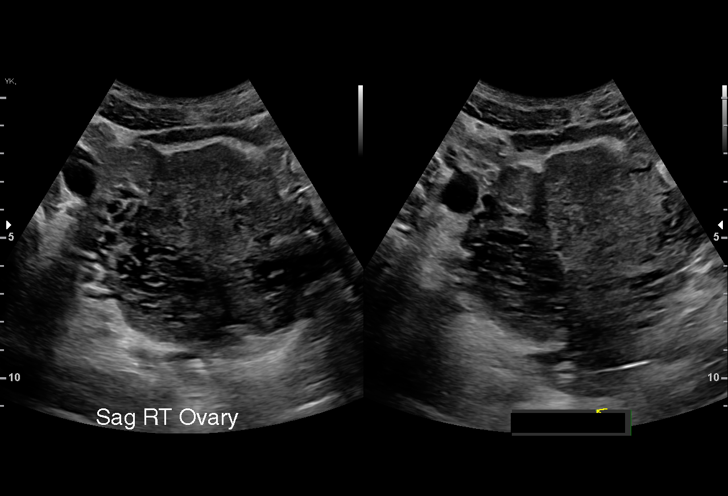
[im 14/62]
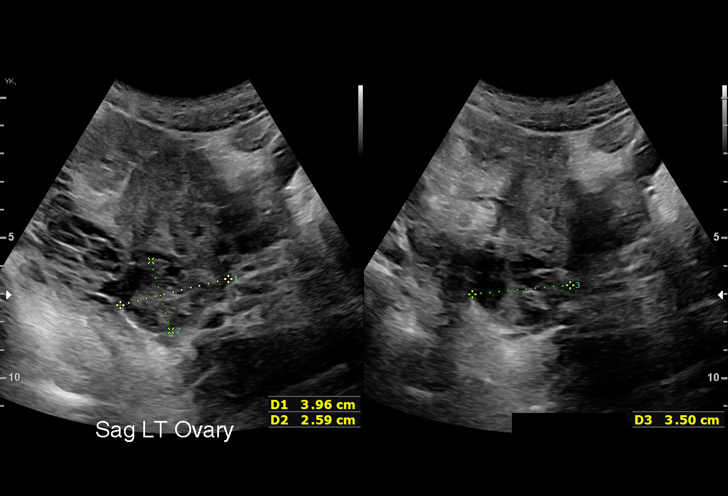
[im 19/62]
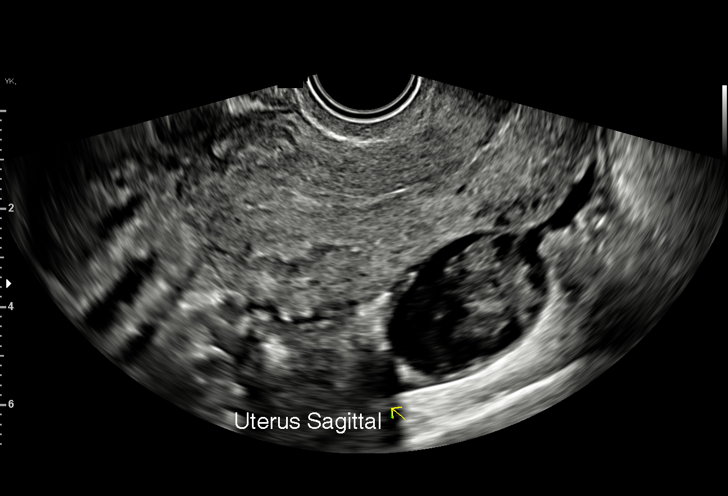
[im 23/62]
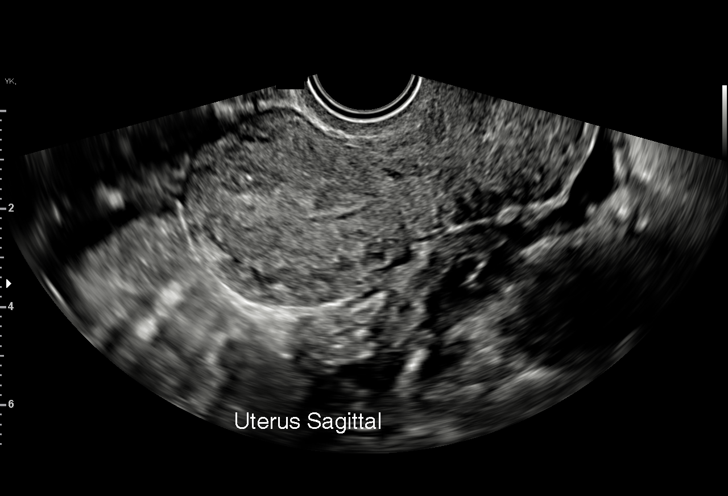
[im 28/62]
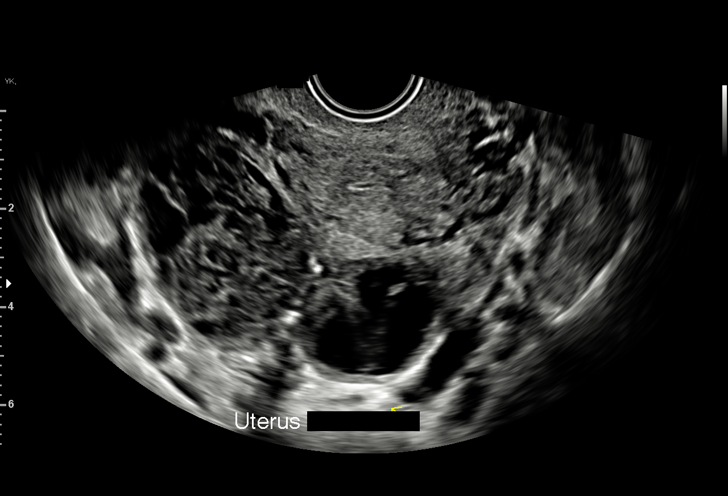
[im 32/62]
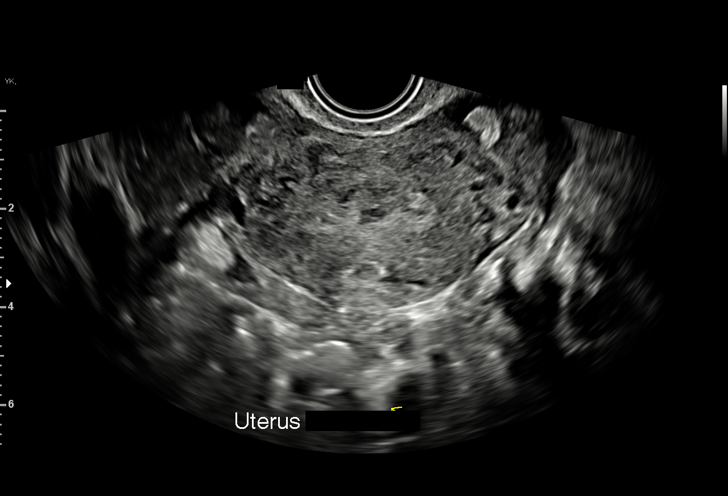
[im 34/62]
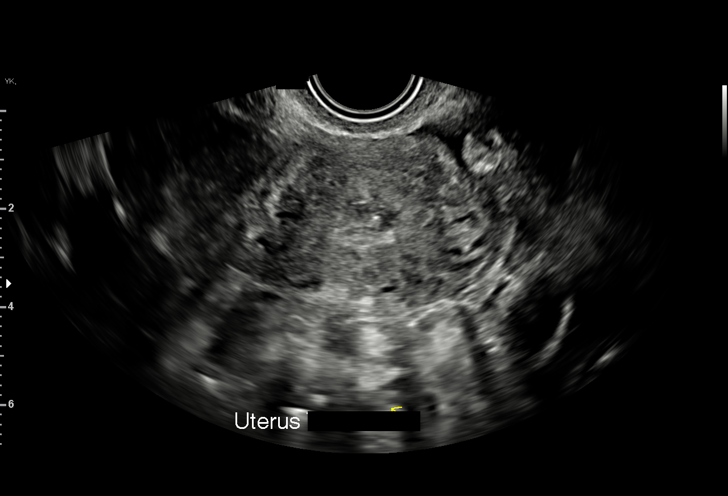
[im 39/62]
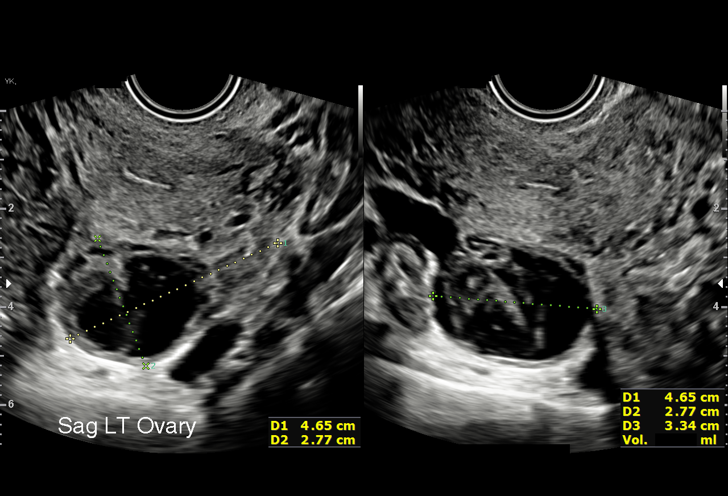
[im 43/62]
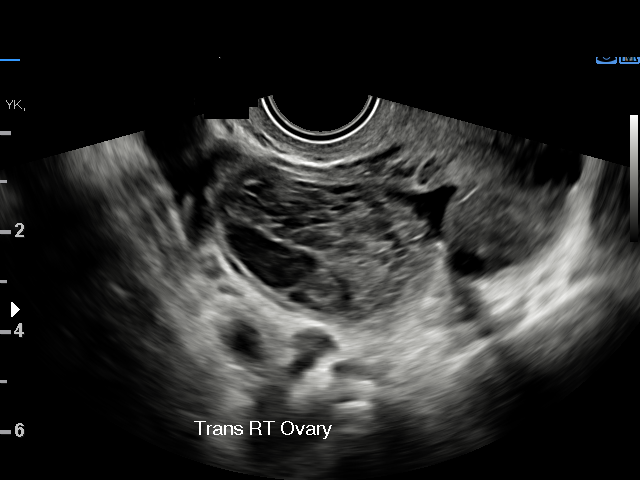
[im 48/62]
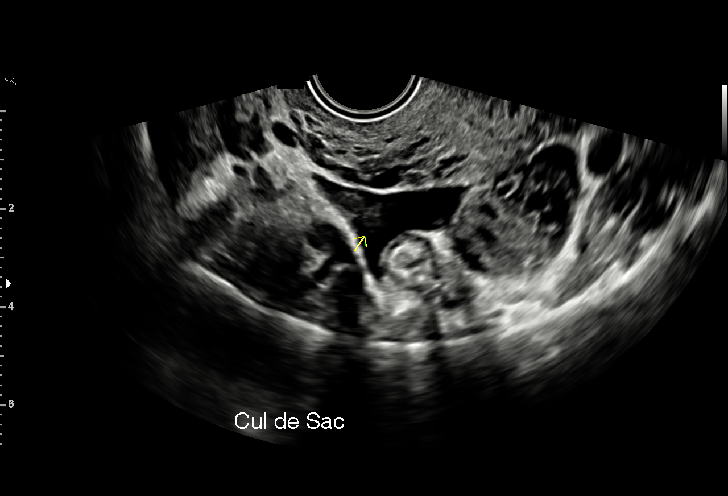
[im 52/62]
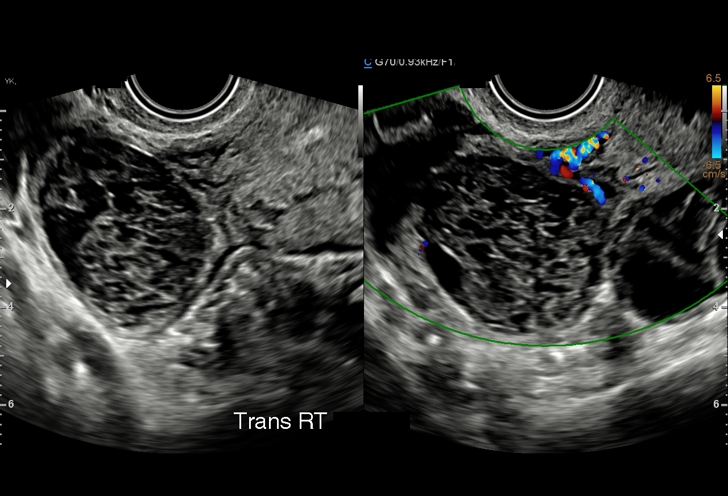
[im 57/62]
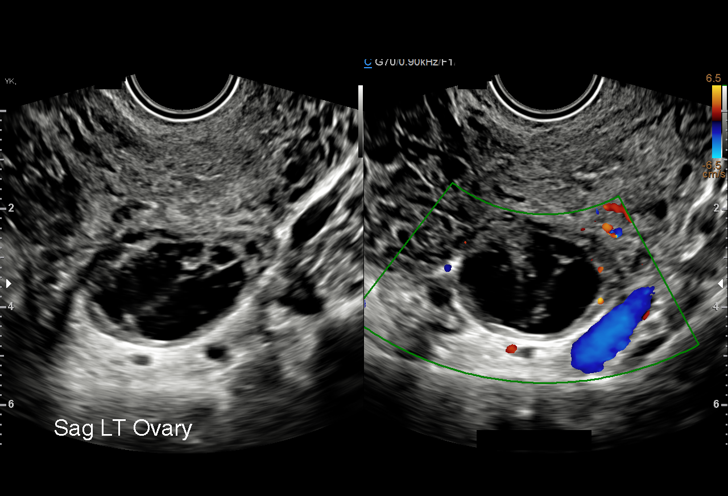
[im 62/62]
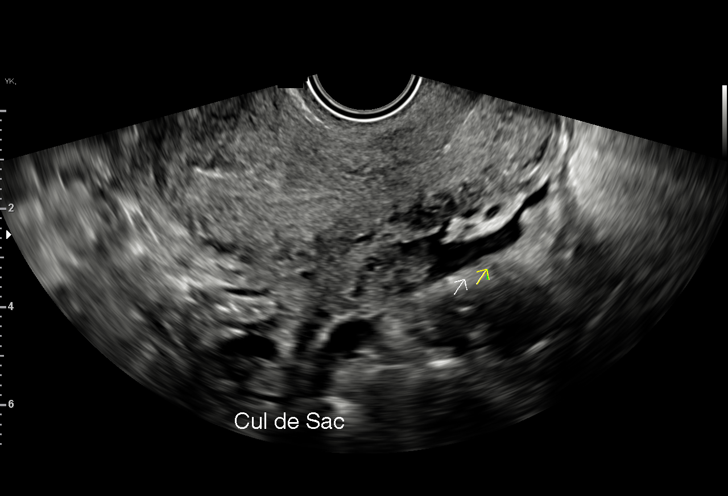

[15 of 28 positions shown; findings below may reference images not displayed]

FINDINGS: Intrauterine gestational sac: Tiny cystic structure in the upper
uterine segment, possibly a gestational sac.

Yolk sac:  Not Visualized.

Embryo:  Not Visualized.

Cardiac Activity: Not Visualized.

MSD: 4.3 mm 5 w 1 d This measurement is of the possible gestational
sac, and not of a conclusive gestational sac.

Subchorionic hemorrhage:  None visualized.

Maternal uterus/adnexae: No uterine masses.  Cervix is closed.

Right ovary measures 5.1 x 3.1 x 4.4 cm. There is a complex cystic
structure involving most of the right ovary, measuring 4.2 x 3 x 4
cm. Left ovary measures 4.7 x 2.8 x 3.3 cm. Complex cyst within the
left ovary measuring 3.1 x 2 x 2.8 cm.

Trace amount of pelvic free fluid.
IMPRESSION: 1. Possible tiny, 4.3 mm, gestational sac in the upper uterine
segment. No yolk sac or fetal pole. Recommend correlation with the
beta HCG level when it becomes available, and if this supports an
early pregnancy, then follow-up ultrasound in 10-14 days to document
normal pregnancy progression.
2. Both ovaries are prominent, containing complicated cysts.

## 2022-06-05 ENCOUNTER — Other Ambulatory Visit: Payer: Self-pay

## 2022-06-05 ENCOUNTER — Emergency Department (HOSPITAL_BASED_OUTPATIENT_CLINIC_OR_DEPARTMENT_OTHER)
Admission: EM | Admit: 2022-06-05 | Discharge: 2022-06-05 | Disposition: A | Discharge status: Home / Self Care | Payer: BC Managed Care – PPO | Attending: Emergency Medicine | Admitting: Emergency Medicine

## 2022-06-05 ENCOUNTER — Encounter (HOSPITAL_BASED_OUTPATIENT_CLINIC_OR_DEPARTMENT_OTHER): Payer: Self-pay | Admitting: Emergency Medicine

## 2022-06-05 DIAGNOSIS — Z1152 Encounter for screening for COVID-19: Secondary | ICD-10-CM | POA: Insufficient documentation

## 2022-06-05 DIAGNOSIS — R509 Fever, unspecified: Secondary | ICD-10-CM | POA: Diagnosis present

## 2022-06-05 DIAGNOSIS — J069 Acute upper respiratory infection, unspecified: Secondary | ICD-10-CM

## 2022-06-05 LAB — RESP PANEL BY RT-PCR (FLU A&B, COVID) ARPGX2
Influenza A by PCR: NEGATIVE
Influenza B by PCR: NEGATIVE
SARS Coronavirus 2 by RT PCR: NEGATIVE

## 2022-06-05 LAB — GROUP A STREP BY PCR: Group A Strep by PCR: NOT DETECTED

## 2022-06-05 LAB — SARS CORONAVIRUS 2 BY RT PCR: SARS Coronavirus 2 by RT PCR: NEGATIVE

## 2022-06-05 MED ORDER — ACETAMINOPHEN 325 MG PO TABS
650.0000 mg | ORAL_TABLET | Freq: Once | ORAL | Status: AC
  Administered 2022-06-05: 650 mg via ORAL
  Filled 2022-06-05: qty 2

## 2022-06-05 NOTE — ED Provider Notes (Signed)
[facility] EMERGENCY DEPARTMENT Provider Note   CSN: [id number] Arrival date & time: 06/05/22  1443     History  Chief Complaint  Patient presents with   Sore Throat    Dawn Proctor is a 26 y.o. female, no pertinent past medical history, who presents to the ED secondary to subjective fevers, chills, sore throat, nasal congestion and productive cough for the last 2 days. Reports she is concerned because she has 3 m.o. at home. Has not taken temperature. Her daughter is sick as well. 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 Denies N/V/abdominal pain, shortness of breath, ear pain. Has not taken any medications for this.      Home Medications Prior to Admission medications   Medication Sig Start Date End Date Taking? Authorizing Provider  acetaminophen (TYLENOL) 325 MG tablet Take 2 tablets (650 mg total) by mouth every 4 (four) hours as needed (for pain scale < 4). 09/02/20   Paula Compton, MD  fluticasone [facility]) 50 MCG/ACT nasal spray Place 1 spray into both nostrils in the morning and at bedtime. 08/19/20 08/19/21  Truett Mainland, DO  ibuprofen (ADVIL) 600 MG tablet Take 1 tablet (600 mg total) by mouth every 6 (six) hours. 02/16/22   Jerelyn Charles, MD  metroNIDAZOLE (FLAGYL) 500 MG tablet Take 1 tablet (500 mg total) by mouth 2 (two) times daily. 02/16/22   Jerelyn Charles, MD  prenatal vitamin w/FE, FA (NATACHEW) 29-1 MG CHEW chewable tablet Chew 1 tablet by mouth daily at 12 noon.    [provider]      Allergies    Orange fruit [citrus], Penicillins, and Tomato    Review of Systems   Review of Systems  Constitutional:  Positive for chills.       +subjective fever  HENT:  Positive for congestion, sneezing and sore throat.   Respiratory:  Positive for cough. Negative for shortness of breath.   Gastrointestinal:  Negative for abdominal pain, diarrhea, nausea and vomiting.    Physical Exam Updated Vital Signs BP 113/70   Pulse 88   Temp  99.1 F (37.3 C) (Oral)   Resp 16   Ht 5\' 6"  (1.676 m)   Wt 68.5 kg   LMP 05/06/2022   SpO2 100%   BMI 24.37 kg/m  Physical Exam Vitals and nursing note reviewed.  Constitutional:      General: She is not in acute distress.    Appearance: She is well-developed.  HENT:     Head: Normocephalic and atraumatic.     Nose: Congestion present.     Mouth/Throat:     Mouth: Mucous membranes are moist.     Tonsils: No tonsillar exudate. 1+ on the right. 1+ on the left.  Eyes:     Conjunctiva/sclera: Conjunctivae normal.  Cardiovascular:     Rate and Rhythm: Normal rate and regular rhythm.     Heart sounds: No murmur heard. Pulmonary:     Effort: Pulmonary effort is normal. No respiratory distress.     Breath sounds: Normal breath sounds.  Abdominal:     Palpations: Abdomen is soft.     Tenderness: There is no abdominal tenderness.  Musculoskeletal:        General: No swelling.     Cervical back: Neck supple.  Skin:    General: Skin is warm and dry.     Capillary Refill: Capillary refill takes less than 2 seconds.  Neurological:     General: No focal deficit present.  Mental Status: She is alert and oriented to person, place, and time.  Psychiatric:        Mood and Affect: Mood normal.     ED Results / Procedures / Treatments   Labs (all labs ordered are listed, but only abnormal results are displayed) Labs Reviewed  GROUP A STREP BY PCR  SARS CORONAVIRUS 2 BY RT PCR  RESP PANEL BY RT-PCR (FLU A&B, COVID) [id number]    EKG None  Radiology No results found.  Procedures Procedures   Medications Ordered in ED Medications  acetaminophen (TYLENOL) tablet 650 mg (650 mg Oral Given 06/05/22 1030)    ED Course/ Medical Decision Making/ A&P                           Medical Decision Making Patient 26 year old female, here for subjective fever, sore throat, chills, sneezing, headache for the last 2 days.  Well-appearing on exam, just fatigued.  COVID, strep, flu  negative.  Tolerating p.o. intake.  Discussed likely viral URI.  Return symptoms.  Tylenol and ibuprofen for symptoms.  Low-grade temp while in the ED however lungs clear to auscultation.  Risk OTC drugs.    Final Clinical Impression(s) / ED Diagnoses Final diagnoses:  Viral upper respiratory tract infection    Rx / DC Orders ED Discharge Orders     None         Jaymeson Mengel, Harley Alto, PA 06/05/22 1103    Virgina Norfolk, DO 06/05/22 1137

## 2022-06-05 NOTE — ED Triage Notes (Signed)
Pt endorses ear pain, sore throat, chills, sneezing and headache for 2 nights that worsened yesterday.

## 2022-06-05 NOTE — Discharge Instructions (Signed)
Please return to the ER if your symptoms are worsening, you have difficulty of breathing, or have a very high fever.  Take Tylenol and ibuprofen for pain control.  You can also use over-the-counter flu/cold medications but make sure that you are tapering your Tylenol or ibuprofen use if they have Tylenol or ibuprofen in it.

## 2022-09-03 ENCOUNTER — Ambulatory Visit
Admission: EM | Admit: 2022-09-03 | Discharge: 2022-09-03 | Disposition: A | Discharge status: Home / Self Care | Payer: BC Managed Care – PPO | Attending: Internal Medicine | Admitting: Internal Medicine

## 2022-09-03 DIAGNOSIS — Z1152 Encounter for screening for COVID-19: Secondary | ICD-10-CM | POA: Diagnosis not present

## 2022-09-03 DIAGNOSIS — R059 Cough, unspecified: Secondary | ICD-10-CM | POA: Diagnosis present

## 2022-09-03 DIAGNOSIS — J029 Acute pharyngitis, unspecified: Secondary | ICD-10-CM | POA: Diagnosis not present

## 2022-09-03 DIAGNOSIS — J069 Acute upper respiratory infection, unspecified: Secondary | ICD-10-CM | POA: Insufficient documentation

## 2022-09-03 LAB — POCT RAPID STREP A (OFFICE): Rapid Strep A Screen: NEGATIVE

## 2022-09-03 MED ORDER — CHLORASEPTIC 1.4 % MT LIQD: 1.0000 | OROMUCOSAL | 0 refills | Status: AC | PRN

## 2022-09-03 MED ORDER — FLUTICASONE PROPIONATE 50 MCG/ACT NA SUSP: 1.0000 | Freq: Every day | NASAL | 0 refills | Status: AC

## 2022-09-03 MED ORDER — CETIRIZINE HCL 10 MG PO TABS: 10.0000 mg | ORAL_TABLET | Freq: Every day | ORAL | 0 refills | Status: AC

## 2022-09-03 MED ORDER — BENZONATATE 100 MG PO CAPS: 100.0000 mg | ORAL_CAPSULE | Freq: Three times a day (TID) | ORAL | 0 refills | Status: AC | PRN
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
## 2022-09-03 NOTE — Discharge Instructions (Signed)
Rapid strep is negative.  Throat culture and COVID test pending.  Will call if test is positive.  I have prescribed you 4 different medications to help alleviate symptoms.  Please follow-up if any symptoms persist or worsen.

## 2022-09-03 NOTE — ED Triage Notes (Signed)
Patient presents to UC for sore throat, fever, chills x 2 days. Hx of strep. Taking tylenol last dose 1100

## 2022-09-04 LAB — SARS CORONAVIRUS 2 (TAT 6-24 HRS): SARS Coronavirus 2: NEGATIVE

## 2022-09-06 LAB — CULTURE, GROUP A STREP (THRC)

## 2023-10-18 ENCOUNTER — Encounter (HOSPITAL_BASED_OUTPATIENT_CLINIC_OR_DEPARTMENT_OTHER): Payer: Self-pay

## 2023-10-18 ENCOUNTER — Emergency Department (HOSPITAL_BASED_OUTPATIENT_CLINIC_OR_DEPARTMENT_OTHER): Admission: EM | Admit: 2023-10-18 | Discharge: 2023-10-18 | Disposition: A | Discharge status: Home / Self Care

## 2023-10-18 ENCOUNTER — Other Ambulatory Visit: Payer: Self-pay

## 2023-10-18 DIAGNOSIS — Z3A01 Less than 8 weeks gestation of pregnancy: Secondary | ICD-10-CM | POA: Insufficient documentation

## 2023-10-18 DIAGNOSIS — R109 Unspecified abdominal pain: Secondary | ICD-10-CM | POA: Diagnosis not present

## 2023-10-18 DIAGNOSIS — O26891 Other specified pregnancy related conditions, first trimester: Secondary | ICD-10-CM | POA: Insufficient documentation

## 2023-10-18 LAB — COMPREHENSIVE METABOLIC PANEL
ALT: 13 U/L (ref 0–44)
AST: 15 U/L (ref 15–41)
Albumin: 3.9 g/dL (ref 3.5–5.0)
Alkaline Phosphatase: 59 U/L (ref 38–126)
Anion gap: 8 (ref 5–15)
BUN: 13 mg/dL (ref 6–20)
CO2: 22 mmol/L (ref 22–32)
Calcium: 8.8 mg/dL — ABNORMAL LOW (ref 8.9–10.3)
Chloride: 106 mmol/L (ref 98–111)
Creatinine, Ser: 0.76 mg/dL (ref 0.44–1.00)
GFR, Estimated: >60 mL/min (ref >60)
Glucose, Bld: 95 mg/dL (ref 70–99)
Potassium: 4.1 mmol/L (ref 3.5–5.1)
Sodium: 136 mmol/L (ref 135–145)
Total Bilirubin: 0.4 mg/dL (ref 0.0–1.2)
Total Protein: 7 g/dL (ref 6.5–8.1)

## 2023-10-18 LAB — URINALYSIS, ROUTINE W REFLEX MICROSCOPIC
Bilirubin Urine: NEGATIVE
Glucose, UA: NEGATIVE mg/dL
Hgb urine dipstick: NEGATIVE
Ketones, ur: NEGATIVE mg/dL
Leukocytes,Ua: NEGATIVE
Nitrite: NEGATIVE
Protein, ur: NEGATIVE mg/dL
Specific Gravity, Urine: >=1.03 (ref 1.005–1.030)
pH: 6 (ref 5.0–8.0)

## 2023-10-18 LAB — CBC
HCT: 36 % (ref 36.0–46.0)
Hemoglobin: 10.9 g/dL — ABNORMAL LOW (ref 12.0–15.0)
MCH: 22.3 pg — ABNORMAL LOW (ref 26.0–34.0)
MCHC: 30.3 g/dL (ref 30.0–36.0)
MCV: 73.6 fL — ABNORMAL LOW (ref 80.0–100.0)
Platelets: 347 10*3/uL (ref 150–400)
RBC: 4.89 MIL/uL (ref 3.87–5.11)
RDW: 14.6 % (ref 11.5–15.5)
WBC: 4.2 10*3/uL (ref 4.0–10.5)
nRBC: 0 % (ref 0.0–0.2)

## 2023-10-18 LAB — HCG, QUANTITATIVE, PREGNANCY: hCG, Beta Chain, Quant, S: 125 m[IU]/mL — ABNORMAL HIGH (ref <5)

## 2023-10-18 NOTE — ED Notes (Signed)
 ED Provider at bedside.

## 2023-10-18 NOTE — ED Provider Notes (Signed)
 [facility] Provider Note   CSN: [id number] Arrival date & time: 10/18/23  0865     History  Chief Complaint  Patient presents with   Abdominal Pain    Dawn Proctor is a 28 y.o. female.  Pt complains of an abnormal period.  Pt reports she had a normal.  In December.  Patient reports she did not have a period in January.  Patient reports that she had a very short period in February from February 4 until February 7.  Patient took a pregnancy test last week on Wednesday and it was positive.  Patient reports that she has been experiencing some cramping.  Patient reports the cramping was relieved with Tylenol.  Patient reports she has had miscarriages in the past and is concerned that she is having a miscarriage.  Patient denies any current abdominal pain.  She denies any fever or chills.  Patient does have a gynecologist that she sees.   Abdominal Pain      Home Medications Prior to Admission medications   Medication Sig Start Date End Date Taking? Authorizing Provider  acetaminophen (TYLENOL) 325 MG tablet Take 2 tablets (650 mg total) by mouth every 4 (four) hours as needed (for pain scale < 4). 09/02/20   Huel Cote, MD  benzonatate (TESSALON) 100 MG capsule Take 1 capsule (100 mg total) by mouth every 8 (eight) hours as needed for cough. 09/03/22   Gustavus Bryant, FNP  cetirizine (ZYRTEC) 10 MG tablet Take 1 tablet (10 mg total) by mouth daily. 09/03/22   Gustavus Bryant, FNP  fluticasone (FLONASE) 50 MCG/ACT nasal spray Place 1 spray into both nostrils daily. 09/03/22   Gustavus Bryant, FNP  ibuprofen (ADVIL) 600 MG tablet Take 1 tablet (600 mg total) by mouth every 6 (six) hours. 02/16/22   Marlow Baars, MD  metroNIDAZOLE (FLAGYL) 500 MG tablet Take 1 tablet (500 mg total) by mouth 2 (two) times daily. 02/16/22   Marlow Baars, MD  phenol (CHLORASEPTIC) 1.4 % LIQD Use as directed 1 spray in the mouth or throat as needed for throat  irritation / pain. 09/03/22   Gustavus Bryant, FNP  prenatal vitamin w/FE, FA (NATACHEW) 29-1 MG CHEW chewable tablet Chew 1 tablet by mouth daily at 12 noon.    [provider]      Allergies    Orange oil, Orange fruit [citrus], Penicillins, and Tomato    Review of Systems   Review of Systems  Gastrointestinal:  Positive for abdominal pain.  All other systems reviewed and are negative.   Physical Exam Updated Vital Signs BP 101/73 (BP Location: Left Arm)   Pulse 67   Temp 98.2 F (36.8 C) (Oral)   Resp 18   Ht 5\' 6"  (1.676 m)   Wt 81.6 kg   LMP  (LMP Unknown) Comment: Last normal period 08/06/23, no period january, light bleeding 09/21/2023  SpO2 100%   BMI 29.05 kg/m  Physical Exam Vitals and nursing note reviewed.  Constitutional:      Appearance: She is well-developed.  HENT:     Head: Normocephalic.     Mouth/Throat:     Mouth: Mucous membranes are moist.  Eyes:     Extraocular Movements: Extraocular movements intact.  Cardiovascular:     Rate and Rhythm: Normal rate and regular rhythm.  Pulmonary:     Effort: Pulmonary effort is normal.  Abdominal:     General: Bowel sounds are normal.  There is no distension.     Palpations: Abdomen is soft.     Tenderness: There is no abdominal tenderness.  Musculoskeletal:        General: Normal range of motion.     Cervical back: Normal range of motion.  Neurological:     Mental Status: She is alert and oriented to person, place, and time.  Psychiatric:        Mood and Affect: Mood normal.     ED Results / Procedures / Treatments   Labs (all labs ordered are listed, but only abnormal results are displayed) Labs Reviewed  CBC - Abnormal; Notable for the following components:      Result Value   Hemoglobin 10.9 (*)    MCV 73.6 (*)    MCH 22.3 (*)    All other components within normal limits  HCG, QUANTITATIVE, PREGNANCY - Abnormal; Notable for the following components:   hCG, Beta Chain, Quant, S 125 (*)     All other components within normal limits  COMPREHENSIVE METABOLIC PANEL - Abnormal; Notable for the following components:   Calcium 8.8 (*)    All other components within normal limits  URINALYSIS, ROUTINE W REFLEX MICROSCOPIC    EKG None  Radiology No results found.  Procedures Procedures    Medications Ordered in ED Medications - No data to display  ED Course/ Medical Decision Making/ A&P                                 Medical Decision Making Patient complains of some abdominal cramping.  Cramping resolved with Tylenol.  Patient had a positive pregnancy test on Wednesday.  Amount and/or Complexity of Data Reviewed External Data Reviewed: notes.    Details: Notes reviewed patient is over positive. Labs: ordered. Decision-making details documented in ED Course.    Details: Patient's quantitative hCG is 125.  Hemoglobin is 10.9  Risk Risk Details: I discussed findings with patient.  I have advised her that she needs a repeat quantitative hCG in 2 days.  Patient states that she can follow-up with her gynecologist.  Patient is declining any further evaluation at this time.  Patient states that she has to leave to get to work.  Patient does not want ultrasound or imaging.  She understands that hCG level being that low could be an indication that she is had a miscarriage, this could be a very early pregnancy or she could have an ectopic pregnancy.  She understands the importance of follow-up and agrees to recheck in 2 days with her gynecologist.  Patient is also advised that if she cannot schedule an appoint with her gynecologist she should go to women's maternity admission for repeat quantitative.           Final Clinical Impression(s) / ED Diagnoses Final diagnoses:  Less than [redacted] weeks gestation of pregnancy    Rx / DC Orders ED Discharge Orders     None     An After Visit Summary was printed and given to the patient.     Elson Areas, PA-C 10/18/23  1055    Coral Spikes, [place] 10/18/23 1504

## 2023-10-18 NOTE — Discharge Instructions (Addendum)
 Go to MAU at [facility] hospital to have repeat HCg in 2 days

## 2023-10-18 NOTE — ED Triage Notes (Addendum)
 2 positive pregnancy test on Wednesday. Started having abdominal cramping last night with feeling of nausea and lightheadedness. States didn't have a menstrual cycle in January, but had some light bleeding Feb 4-7. Last normal menstrual cycle 08/06/23. Hx of miscarriage.  G6P3 1 abortion, 1 miscarriage

## 2023-11-01 ENCOUNTER — Inpatient Hospital Stay (HOSPITAL_COMMUNITY)
Admission: AD | Admit: 2023-11-01 | Discharge: 2023-11-01 | Disposition: A | Discharge status: Home / Self Care | Attending: Obstetrics and Gynecology | Admitting: Obstetrics and Gynecology

## 2023-11-01 ENCOUNTER — Inpatient Hospital Stay (HOSPITAL_COMMUNITY)

## 2023-11-01 ENCOUNTER — Encounter: Payer: Self-pay | Admitting: Student

## 2023-11-01 ENCOUNTER — Other Ambulatory Visit: Payer: Self-pay

## 2023-11-01 DIAGNOSIS — Z113 Encounter for screening for infections with a predominantly sexual mode of transmission: Secondary | ICD-10-CM | POA: Insufficient documentation

## 2023-11-01 DIAGNOSIS — O209 Hemorrhage in early pregnancy, unspecified: Secondary | ICD-10-CM | POA: Insufficient documentation

## 2023-11-01 DIAGNOSIS — Z3A01 Less than 8 weeks gestation of pregnancy: Secondary | ICD-10-CM | POA: Insufficient documentation

## 2023-11-01 DIAGNOSIS — O208 Other hemorrhage in early pregnancy: Secondary | ICD-10-CM

## 2023-11-01 LAB — URINALYSIS, ROUTINE W REFLEX MICROSCOPIC
Bilirubin Urine: NEGATIVE
Glucose, UA: NEGATIVE mg/dL
Ketones, ur: NEGATIVE mg/dL
Leukocytes,Ua: NEGATIVE
Nitrite: NEGATIVE
Protein, ur: NEGATIVE mg/dL
Specific Gravity, Urine: 1.031 — ABNORMAL HIGH (ref 1.005–1.030)
pH: 5 (ref 5.0–8.0)

## 2023-11-01 LAB — POCT PREGNANCY, URINE: Preg Test, Ur: POSITIVE — AB

## 2023-11-01 LAB — WET PREP, GENITAL
Clue Cells Wet Prep HPF POC: NONE SEEN
Sperm: NONE SEEN
Trich, Wet Prep: NONE SEEN
WBC, Wet Prep HPF POC: <10 (ref <10)
Yeast Wet Prep HPF POC: NONE SEEN

## 2023-11-01 NOTE — MAU Note (Signed)
 Dawn Proctor is a 28 y.o. at [redacted]w[redacted]d here in MAU reporting: she had heavy VB on Saturday night, but is now spotting.  Reports intermittent cramping, none currently.  States last intercourse was Friday night.  LMP: 09/21/2023 Onset of complaint: Saturday night Pain score: 0 Vitals:   11/01/23 0842  BP: 110/62  Pulse: 65  Resp: 18  Temp: 97.9 F (36.6 C)  SpO2: 100%     FHT: NA  Lab orders placed from triage: UA

## 2023-11-01 NOTE — MAU Provider Note (Addendum)
 History     CSN: [id number]  Arrival date and time: 11/01/23 0831   Event Date/Time   First Provider Initiated Contact with Patient 11/01/23 [phone]      Chief Complaint  Patient presents with   Vaginal Bleeding   Patient is a 28 yo [id number] who presents with vaginal bleeding at [redacted]w[redacted]d. She reports that her pregnancy has been confirmed by 2 pregnancy tests and increases bHCG levels. Her current symptoms started on Friday with nausea which she relates to the stomach bug since her family has been fighting this at the moment. She reports taking some Pepto Bismol to help moderate her symptoms on Friday. Saturday she was out of town and reports she noticed moderate vaginal bleeding, that soaked her underwear and pants. Her bleeding has decreased in quantity since, but she continues to spot today. Endorses cramping as well, but mild pain.     OB History     Gravida  7   Para  3   Term  2   Preterm  1   AB  3   Living  3      SAB  1   IAB  2   Ectopic      Multiple  0   Live Births  3           Past Medical History:  Diagnosis Date   Allergy    Anxiety    Headache    Knee fracture 2015   Seasonal allergies     Past Surgical History:  Procedure Laterality Date   I & D EXTREMITY Left 03/04/2014   Procedure: IRRIGATION AND DEBRIDEMENT KNEE ARTHROTOMY WITH REMOVAL OF BULLET FRAGMENTS;  Surgeon: Eldred Manges, MD;  Location: [facility] OR;  Service: Orthopedics;  Laterality: Left;   KNEE SURGERY      Family History  Problem Relation Age of Onset   Dementia Maternal Grandmother    Emphysema Paternal Grandmother    COPD Paternal Grandmother     Social History   Tobacco Use   Smoking status: Former    Current packs/day: 0.50    Types: Cigarettes   Smokeless tobacco: Never  Vaping Use   Vaping status: Every Day  Substance Use Topics   Alcohol use: Yes   Drug use: Not Currently    Types: Marijuana    Comment: occ    Allergies:  Allergies  Allergen Reactions    Orange Oil Anaphylaxis   Orange Fruit [Citrus] Swelling    Swelling around the lips   Penicillins Hives    Has patient had a PCN reaction causing immediate rash, facial/tongue/throat swelling, SOB or lightheadedness with hypotension: No Has patient had a PCN reaction causing severe rash involving mucus membranes or skin necrosis: No Has patient had a PCN reaction that required hospitalization No Has patient had a PCN reaction occurring within the last 10 years: Yes If all of the above answers are "NO", then may proceed with Cephalosporin use.    Tomato Swelling    Swelling around the lips    Medications Prior to Admission  Medication Sig Dispense Refill Last Dose/Taking   acetaminophen (TYLENOL) 325 MG tablet Take 2 tablets (650 mg total) by mouth every 4 (four) hours as needed (for pain scale < 4). 30 tablet 0 Unknown   benzonatate (TESSALON) 100 MG capsule Take 1 capsule (100 mg total) by mouth every 8 (eight) hours as needed for cough. (Patient not taking: Reported on 11/01/2023) 21 capsule 0 Not Taking  cetirizine (ZYRTEC) 10 MG tablet Take 1 tablet (10 mg total) by mouth daily. (Patient not taking: Reported on 11/01/2023) 30 tablet 0 Not Taking   fluticasone (FLONASE) 50 MCG/ACT nasal spray Place 1 spray into both nostrils daily. (Patient not taking: Reported on 11/01/2023) 16 g 0 Not Taking   ibuprofen (ADVIL) 600 MG tablet Take 1 tablet (600 mg total) by mouth every 6 (six) hours. (Patient not taking: Reported on 11/01/2023) 60 tablet 0 Not Taking   metroNIDAZOLE (FLAGYL) 500 MG tablet Take 1 tablet (500 mg total) by mouth 2 (two) times daily. (Patient not taking: Reported on 11/01/2023) 12 tablet 0 Not Taking   phenol (CHLORASEPTIC) 1.4 % LIQD Use as directed 1 spray in the mouth or throat as needed for throat irritation / pain. 118 mL 0    prenatal vitamin w/FE, FA (NATACHEW) 29-1 MG CHEW chewable tablet Chew 1 tablet by mouth daily at 12 noon. (Patient not taking: Reported on  11/01/2023)   Not Taking    Review of Systems  Gastrointestinal:  Positive for abdominal pain.  Genitourinary:  Positive for vaginal bleeding.   Physical Exam   Blood pressure 110/62, pulse 65, temperature 97.9 F (36.6 C), temperature source Oral, resp. rate 18, height 5\' 6"  (1.676 m), weight 84.1 kg, last menstrual period 09/21/2023, SpO2 100%, not currently breastfeeding.  Physical Exam Constitutional:      Appearance: Normal appearance.  HENT:     Head: Normocephalic and atraumatic.     Right Ear: External ear normal.     Left Ear: External ear normal.     Nose: Nose normal.  Eyes:     Conjunctiva/sclera: Conjunctivae normal.  Pulmonary:     Effort: Pulmonary effort is normal.  Genitourinary:    General: Normal vulva.     Vagina: Vaginal discharge (bleeding) present.     Rectum: Normal.  Skin:    General: Skin is warm and dry.  Neurological:     General: No focal deficit present.     Mental Status: She is alert and oriented to person, place, and time.  Psychiatric:        Mood and Affect: Mood normal.        Behavior: Behavior normal.    MAU Course  Procedures  MDM - UPT, UA, Wet prep, GC/Chlamydia - Labs: CBC, bHCG; however patient declined labs at this time - [place] to prove IUP - Speculum exam performed which demonstrated old blood, no active bleeding source noted at this time.  -RH positive Assessment and Plan  Vaginal Bleeding in Pregnancy 5w gestation of intrauterine pregnancy  UPT positive. UA wnl and wet prep negative. GC/Chlamydia pending. US demonstrated single live intrauterine gestation with CRL corresponding to [redacted]w[redacted]d. Probable small 4x66mm subchorionic hemorrhage noted on [place], not well evaluated on exam. Discussed that this is an abnormal finding, but common in first trimester pregnancy. Discussed that bleeding is likely secondary to this finding, patient understood. Patient is ready for discharge. Return precautions and work note provided.  [name], DO 11/01/2023, 11:32 AM      Attestation of Supervision of Resident:  I confirm that I have verified the information documented in the  resident's note and that I have also personally reperformed the history, physical exam and all medical decision making activities.  I have verified that all services and findings are accurately documented in this student's note; and I agree with management and plan as outlined in the documentation. I have also made any necessary editorial  changes.   Judeth Horn, NP [facility] [employer], [facility] 11/01/2023 12:08 PM

## 2023-11-01 NOTE — Discharge Instructions (Addendum)
 It was great to see you today! Dawn Proctor was seen for vaginal bleeding. We did a urine pregnancy test which was positive, swabs for STD screening, and an ultrasound to verify intrauterine pregnancy.  Please avoid taking Pepto Bismol in pregnancy.   Thank you for allowing me to participate in your care, Dawn Curling, DO 11/01/2023, 11:14 AM  Return to care  If you have heavier bleeding that soaks through more than 2 pads per hour for an hour or more If you bleed so much that you feel like you might pass out or you do pass out If you have significant abdominal pain that is not improved with Tylenol                    Safe Medications in Pregnancy    Acne: Benzoyl Peroxide Salicylic Acid  Backache/Headache: Tylenol: 2 regular strength every 4 hours OR              2 Extra strength every 6 hours  Colds/Coughs/Allergies: Benadryl (alcohol free) 25 mg every 6 hours as needed Breath right strips Claritin Cepacol throat lozenges Chloraseptic throat spray Cold-Eeze- up to three times per day Cough drops, alcohol free Flonase (by prescription only) Guaifenesin Mucinex Robitussin DM (plain only, alcohol free) Saline nasal spray/drops Sudafed (pseudoephedrine) & Actifed ** use only after [redacted] weeks gestation and if you do not have high blood pressure Tylenol Vicks Vaporub Zinc lozenges Zyrtec   Constipation: Colace Ducolax suppositories Fleet enema Glycerin suppositories Metamucil Milk of magnesia Miralax Senokot Smooth move tea  Diarrhea: Kaopectate Imodium A-D  *NO pepto Bismol  Hemorrhoids: Anusol Anusol HC Preparation H Tucks  Indigestion: Tums Maalox Mylanta Zantac  Pepcid  Insomnia: Benadryl (alcohol free) 25mg  every 6 hours as needed Tylenol PM Unisom, no Gelcaps  Leg Cramps: Tums MagGel  Nausea/Vomiting:  Bonine Dramamine Emetrol Ginger extract Sea bands Meclizine  Nausea medication to take during pregnancy:  Unisom  (doxylamine succinate 25 mg tablets) Take one tablet daily at bedtime. If symptoms are not adequately controlled, the dose can be increased to a maximum recommended dose of two tablets daily (1/2 tablet in the morning, 1/2 tablet mid-afternoon and one at bedtime). Vitamin B6 100mg  tablets. Take one tablet twice a day (up to 200 mg per day).  Skin Rashes: Aveeno products Benadryl cream or 25mg  every 6 hours as needed Calamine Lotion 1% cortisone cream  Yeast infection: Gyne-lotrimin 7 Monistat 7   **If taking multiple medications, please check labels to avoid duplicating the same active ingredients **take medication as directed on the label ** Do not exceed 4000 mg of tylenol in 24 hours **Do not take medications that contain aspirin or ibuprofen

## 2023-11-02 LAB — GC/CHLAMYDIA PROBE AMP (~~LOC~~) NOT AT ARMC
Chlamydia: NEGATIVE
Comment: NEGATIVE
Comment: NORMAL
Neisseria Gonorrhea: NEGATIVE
